# Patient Record
Sex: Female | Born: 1970 | Race: White | Hispanic: No | Marital: Single | Smoking: Current every day smoker
Health system: Southern US, Community
[De-identification: ages and names within clinical notes are randomized; demographics above are authoritative.]

## PROBLEM LIST (undated history)

## (undated) DIAGNOSIS — Z01812 Encounter for preprocedural laboratory examination: Secondary | ICD-10-CM

## (undated) DIAGNOSIS — G5602 Carpal tunnel syndrome, left upper limb: Secondary | ICD-10-CM

## (undated) DIAGNOSIS — K2289 Other specified disease of esophagus: Secondary | ICD-10-CM

## (undated) DIAGNOSIS — R42 Dizziness and giddiness: Secondary | ICD-10-CM

## (undated) DIAGNOSIS — E559 Vitamin D deficiency, unspecified: Secondary | ICD-10-CM

## (undated) DIAGNOSIS — M1990 Unspecified osteoarthritis, unspecified site: Secondary | ICD-10-CM

## (undated) DIAGNOSIS — G43909 Migraine, unspecified, not intractable, without status migrainosus: Secondary | ICD-10-CM

## (undated) HISTORY — PX: TUBAL LIGATION: SHX77

---

## 2017-01-15 ENCOUNTER — Emergency Department (HOSPITAL_COMMUNITY): Payer: Self-pay

## 2017-01-15 ENCOUNTER — Emergency Department (HOSPITAL_COMMUNITY)
Admission: EM | Admit: 2017-01-15 | Discharge: 2017-01-15 | Disposition: A | Discharge status: Home / Self Care | Payer: Self-pay | Attending: Emergency Medicine | Admitting: Emergency Medicine

## 2017-01-15 ENCOUNTER — Encounter (HOSPITAL_COMMUNITY): Payer: Self-pay

## 2017-01-15 DIAGNOSIS — Y92831 Amusement park as the place of occurrence of the external cause: Secondary | ICD-10-CM | POA: Insufficient documentation

## 2017-01-15 DIAGNOSIS — S060X0A Concussion without loss of consciousness, initial encounter: Secondary | ICD-10-CM | POA: Insufficient documentation

## 2017-01-15 DIAGNOSIS — F17200 Nicotine dependence, unspecified, uncomplicated: Secondary | ICD-10-CM | POA: Insufficient documentation

## 2017-01-15 DIAGNOSIS — W19XXXA Unspecified fall, initial encounter: Secondary | ICD-10-CM

## 2017-01-15 DIAGNOSIS — Y9389 Activity, other specified: Secondary | ICD-10-CM | POA: Insufficient documentation

## 2017-01-15 DIAGNOSIS — W01198A Fall on same level from slipping, tripping and stumbling with subsequent striking against other object, initial encounter: Secondary | ICD-10-CM | POA: Insufficient documentation

## 2017-01-15 DIAGNOSIS — R42 Dizziness and giddiness: Secondary | ICD-10-CM | POA: Insufficient documentation

## 2017-01-15 DIAGNOSIS — Y998 Other external cause status: Secondary | ICD-10-CM | POA: Insufficient documentation

## 2017-01-15 HISTORY — DX: Dizziness and giddiness: R42

## 2017-01-15 HISTORY — DX: Vitamin D deficiency, unspecified: E55.9

## 2017-01-15 HISTORY — DX: Unspecified osteoarthritis, unspecified site: M19.90

## 2017-01-15 HISTORY — DX: Migraine, unspecified, not intractable, without status migrainosus: G43.909

## 2017-01-15 MED ORDER — MECLIZINE HCL 25 MG PO TABS: 25.0000 mg | ORAL_TABLET | Freq: Three times a day (TID) | ORAL | 0 refills | Status: AC | PRN

## 2017-01-15 MED ORDER — MECLIZINE HCL 25 MG PO TABS
25.0000 mg | ORAL_TABLET | Freq: Once | ORAL | Status: AC
  Administered 2017-01-15: 25 mg via ORAL
  Filled 2017-01-15: qty 1

## 2017-01-15 MED ORDER — ONDANSETRON HCL 4 MG PO TABS: 4.0000 mg | ORAL_TABLET | Freq: Three times a day (TID) | ORAL | 0 refills | Status: AC | PRN

## 2017-01-15 MED ORDER — ONDANSETRON 4 MG PO TBDP
4.0000 mg | ORAL_TABLET | Freq: Once | ORAL | Status: AC
  Administered 2017-01-15: 4 mg via ORAL
  Filled 2017-01-15: qty 1

## 2017-01-15 MED ORDER — KETOROLAC TROMETHAMINE 60 MG/2ML IM SOLN: 60.0000 mg | Freq: Once | INTRAMUSCULAR | Status: DC

## 2017-01-15 MED ORDER — KETOROLAC TROMETHAMINE 30 MG/ML IJ SOLN
30.0000 mg | Freq: Once | INTRAMUSCULAR | Status: AC
  Administered 2017-01-15: 30 mg via INTRAVENOUS
  Filled 2017-01-15: qty 1

## 2017-01-15 MED ORDER — CYCLOBENZAPRINE HCL 10 MG PO TABS: 10.0000 mg | ORAL_TABLET | Freq: Three times a day (TID) | ORAL | 0 refills | Status: AC | PRN

## 2017-01-15 NOTE — ED Triage Notes (Signed)
Pt brought in by EMS due to having fall. Pt feel backwards and hit head. Pt denies LOC. Pt endorse, dizziness, HA, and nausea. Pt a&ox4.

## 2017-01-15 NOTE — ED Provider Notes (Signed)
[facility]-EMERGENCY DEPT Provider Note   CSN: [id number] Arrival date & time: 01/15/17  1652     History   Chief Complaint Chief Complaint  Patient presents with  . Fall  . Dizziness    HPI Monica Rowe is a 46 y.o. female.  HPI   Patient with hx vertigo, migraine brought in by EMS after mechanical fall at [employer].  States she was in the locker room and turned around, slipped on a wet spot on the floor, and fell backwards striking the back of her head on a locker.  Denies LOC.  States she was "stunned" and "saw stars," and has been "out of it" and "dizzy."  She describes the sensation as "swimmy headed" and "slow motion" like she's intoxicated.  She is sensitive to lights, nauseated.  The pain is in the back of her head and radiates into her face. She also has neck pain and right shoulder pain.The fall occurred at 2:30pm.  Pt denies hx concussion.  Denies any other pain or injury.  Denies weakness or numbness of the extremities, visual changes, vomiting.    Past Medical History:  Diagnosis Date  . Arthritis   . Migraine   . Vertigo   . Vitamin D deficiency     There are no active problems to display for this patient.   Past Surgical History:  Procedure Laterality Date  . CESAREAN SECTION    . TUBAL LIGATION      OB History    No data available       Home Medications    Prior to Admission medications   Medication Sig Start Date End Date Taking? Authorizing Provider  cyclobenzaprine (FLEXERIL) 10 MG tablet Take 1 tablet (10 mg total) by mouth 3 (three) times daily as needed for muscle spasms (or pain). 01/15/17   Trixie DredgeWest, Lathyn Griggs, PA-C  meclizine (ANTIVERT) 25 MG tablet Take 1 tablet (25 mg total) by mouth 3 (three) times daily as needed for dizziness. 01/15/17   Trixie DredgeWest, Haddy Mullinax, PA-C  ondansetron (ZOFRAN) 4 MG tablet Take 1 tablet (4 mg total) by mouth every 8 (eight) hours as needed for nausea or vomiting. 01/15/17   Trixie DredgeWest, Arius Harnois, PA-C    Family History History reviewed. No  pertinent family history.  Social History Social History  Substance Use Topics  . Smoking status: Current Every Day Smoker  . Smokeless tobacco: Never Used  . Alcohol use No     Allergies   Aspirin   Review of Systems Review of Systems  All other systems reviewed and are negative.    Physical Exam Updated Vital Signs BP 140/62   Pulse 69   Temp 98.6 F (37 C) (Oral)   Resp 12   Ht 5\' 1"  (1.549 m)   Wt 83.5 kg (184 lb)   SpO2 97%   BMI 34.77 kg/m   Physical Exam  Constitutional: She is oriented to person, place, and time. She appears well-developed and well-nourished. No distress.  HENT:  Head: Normocephalic and atraumatic.  Neck:  Towel around neck  Cardiovascular: Normal rate and regular rhythm.   Pulmonary/Chest: Effort normal and breath sounds normal. No respiratory distress. She has no wheezes. She has no rales.  Abdominal: Soft. She exhibits no distension. There is no tenderness. There is no rebound and no guarding.  Neurological: She is alert and oriented to person, place, and time. She has normal strength. No cranial nerve deficit or sensory deficit. She exhibits normal muscle tone. GCS eye subscore is  4. GCS verbal subscore is 5. GCS motor subscore is 6.  Skin: She is not diaphoretic.  Nursing note and vitals reviewed.    ED Treatments / Results  Labs (all labs ordered are listed, but only abnormal results are displayed) Labs Reviewed - No data to display  EKG  EKG Interpretation  Date/Time:  Tuesday January 15 2017 17:14:15 EDT Ventricular Rate:  69 PR Interval:    QRS Duration: 73 QT Interval:  419 QTC Calculation: 449 R Axis:   -3 Text Interpretation:  Sinus rhythm Low voltage, precordial leads No previous ECGs available Confirmed by Richardean Canal [phone]) on 01/15/2017 5:23:19 PM       Radiology Dg Shoulder Right  Result Date: 01/15/2017 CLINICAL DATA:  Slip and fall with right shoulder pain, initial encounter EXAM: RIGHT SHOULDER - 2+  VIEW COMPARISON:  None. FINDINGS: Mild degenerative changes of the acromioclavicular joint are noted. No acute fracture or dislocation is seen. The underlying bony thorax is within normal limits. IMPRESSION: Mild degenerative change without acute abnormality. Electronically Signed   By: Alcide Clever M.D.   On: 01/15/2017 18:18   Ct Head Wo Contrast  Result Date: 01/15/2017 CLINICAL DATA:  Mechanical fall, struck head, concussion symptoms, headache, smoker EXAM: CT HEAD WITHOUT CONTRAST CT CERVICAL SPINE WITHOUT CONTRAST TECHNIQUE: Multidetector CT imaging of the head and cervical spine was performed following the standard protocol without intravenous contrast. Multiplanar CT image reconstructions of the cervical spine were also generated. COMPARISON:  None FINDINGS: CT HEAD FINDINGS Brain: Normal ventricular morphology. No midline shift or mass effect. Normal appearance of brain parenchyma. No intracranial hemorrhage, mass lesion, or evidence acute infarction. No extra-axial fluid collections. Vascular: Unremarkable Skull: Intact Sinuses/Orbits: Opacified LEFT maxillary sinus with air-fluid level. No visualized maxillary sinus fracture. Remaining paranasal sinuses clear. Other: N/A CT CERVICAL SPINE FINDINGS Alignment: Normal Skull base and vertebrae: Skullbase intact. Vertebral body heights maintained. No fracture or bone destruction. Soft tissues and spinal canal: Prevertebral soft tissues normal thickness. Visualize cervical cyst soft tissues otherwise unremarkable. Disc levels:  Unremarkable Upper chest: Lung apices clear Other: N/A IMPRESSION: No acute intracranial abnormalities. LEFT maxillary sinus disease as above. No acute cervical spine abnormalities. Electronically Signed   By: Ulyses Southward M.D.   On: 01/15/2017 18:09   Ct Cervical Spine Wo Contrast  Result Date: 01/15/2017 CLINICAL DATA:  Mechanical fall, struck head, concussion symptoms, headache, smoker EXAM: CT HEAD WITHOUT CONTRAST CT CERVICAL  SPINE WITHOUT CONTRAST TECHNIQUE: Multidetector CT imaging of the head and cervical spine was performed following the standard protocol without intravenous contrast. Multiplanar CT image reconstructions of the cervical spine were also generated. COMPARISON:  None FINDINGS: CT HEAD FINDINGS Brain: Normal ventricular morphology. No midline shift or mass effect. Normal appearance of brain parenchyma. No intracranial hemorrhage, mass lesion, or evidence acute infarction. No extra-axial fluid collections. Vascular: Unremarkable Skull: Intact Sinuses/Orbits: Opacified LEFT maxillary sinus with air-fluid level. No visualized maxillary sinus fracture. Remaining paranasal sinuses clear. Other: N/A CT CERVICAL SPINE FINDINGS Alignment: Normal Skull base and vertebrae: Skullbase intact. Vertebral body heights maintained. No fracture or bone destruction. Soft tissues and spinal canal: Prevertebral soft tissues normal thickness. Visualize cervical cyst soft tissues otherwise unremarkable. Disc levels:  Unremarkable Upper chest: Lung apices clear Other: N/A IMPRESSION: No acute intracranial abnormalities. LEFT maxillary sinus disease as above. No acute cervical spine abnormalities. Electronically Signed   By: Ulyses Southward M.D.   On: 01/15/2017 18:09    Procedures Procedures (including critical care time)  Medications Ordered in ED Medications  meclizine (ANTIVERT) tablet 25 mg (25 mg Oral Given 01/15/17 1901)  ondansetron (ZOFRAN-ODT) disintegrating tablet 4 mg (4 mg Oral Given 01/15/17 1901)  ketorolac (TORADOL) 30 MG/ML injection 30 mg (30 mg Intravenous Given 01/15/17 1901)     Initial Impression / Assessment and Plan / ED Course  I have reviewed the triage vital signs and the nursing notes.  Pertinent labs & imaging results that were available during my care of the patient were reviewed by me and considered in my medical decision making (see chart for details).     Afebrile, nontoxic patient with mechanical  fall, hitting head on locker, with NO LOC but with description concerning for concussion.  CT head, c-spine, right shoulder xray negative for acute pathology.  Symptoms treatment.   D/C home with symptomatic medications, concussion clinic and PCP follow up.  Discussed result, findings, treatment, and follow up  with patient.  Pt given return precautions.  Pt verbalizes understanding and agrees with plan.       Final Clinical Impressions(s) / ED Diagnoses   Final diagnoses:  Fall, initial encounter  Concussion without loss of consciousness, initial encounter    New Prescriptions New Prescriptions   CYCLOBENZAPRINE (FLEXERIL) 10 MG TABLET    Take 1 tablet (10 mg total) by mouth 3 (three) times daily as needed for muscle spasms (or pain).   MECLIZINE (ANTIVERT) 25 MG TABLET    Take 1 tablet (25 mg total) by mouth 3 (three) times daily as needed for dizziness.   ONDANSETRON (ZOFRAN) 4 MG TABLET    Take 1 tablet (4 mg total) by mouth every 8 (eight) hours as needed for nausea or vomiting.     Trixie Dredge, Cordelia Poche 01/15/17 1915    Charlynne Pander, MD 01/17/17 Barry Brunner

## 2017-01-15 NOTE — Discharge Instructions (Signed)
Read the information below.  Use the prescribed medication as directed.  Please discuss all new medications with your pharmacist.  You may return to the Emergency Department at any time for worsening condition or any new symptoms that concern you.     Please call Dr Michaelle CopasSmith's office to follow up at the concussion clinic.    Take medications as needed for symptom management.  You have had a head injury which does not appear to require admission at this time. A concussion is a state of changed mental ability from trauma. SEEK IMMEDIATE MEDICAL ATTENTION IF: There is confusion or drowsiness (although children frequently become drowsy after injury).  You cannot awaken the injured person.  There is nausea (feeling sick to your stomach) or continued, forceful vomiting.  You notice dizziness or unsteadiness which is getting worse, or inability to walk.  You have convulsions or unconsciousness.  You experience severe, persistent headaches not relieved by Tylenol?. (Do not take aspirin as this impairs clotting abilities). Take other pain medications only as directed.  You cannot use arms or legs normally.  There are changes in pupil sizes. (This is the black center in the colored part of the eye)  There is clear or bloody discharge from the nose or ears.  Change in speech, vision, swallowing, or understanding.  Localized weakness, numbness, tingling, or change in bowel or bladder control.

## 2017-01-24 ENCOUNTER — Telehealth: Payer: Self-pay

## 2017-01-24 NOTE — Telephone Encounter (Signed)
Patient called concussion hotline after a referral from the ED. She slipped and feel on 7/17 at [employer] hitting her head on the posterior side 2 times: once on a locker and then on the floor. She did not lose consciousness but saw "stars", developed a headache and nausea. The nausea has persisted and she feels like she is in slow motion. She does not have a history of concussion but does have a history of migraines and vertigo. 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 She notes that working on the computer does seem to make her worse. Recommended to patient to refrain from physical activity except walking on flat surfaces 10 minutes a day and to take frequent breaks from work. Also told patient to go back into ED if she has sudden onset of symptoms that are too extreme to manage. Patient voices understanding.

## 2017-01-28 NOTE — Progress Notes (Deleted)
Subjective:   @VITALSMCOMMENTS @  Chief Complaint: Monica Rowe, DOB: April 18, 1971, is a 46 y.o. female who presents for No chief complaint on file.   Injury date : *** Visit #: ***  History of Present Illness:   Patient's goals/priorities: ***  CLASS CURRENT GRADE COMMENTS                               Concussion Self-Reported Symptom Score Symptoms rated on a scale 1-6, in last 24 hours  Headache: ***    Nausea: ***  Vomiting: ***  Balance Difficulty: ***   Dizziness: ***  Fatigue: ***  Trouble Falling Asleep: ***   Sleep More Than Usual: ***  Sleep Less Than Usual: ***  Daytime Drowsiness: ***  Photophobia: ***  Phonophobia: ***  Feeling anxious: ***  Confused: ***  Irritability: ***  Sadness: ***  Nervousness: ***  Feeling More Emotional: ***  Numbness or Tingling: ***  Feeling Slowed Down: ***  Feeling Mentally Foggy: ***  Difficulty Concentrating: ***  Difficulty Remembering: ***  Visual Problems: ***  Neck Pain: ***  Tinnitus: ***   Total Symptom Score: *** Previous Symptom Score: ***  Review of Systems: Pertinent items are noted in HPI.  Review of History: Past Medical History: @PMHP @  Past Surgical History:  has a past surgical history that includes Tubal ligation and Cesarean section. Family History: family history is not on file. Social History:  reports that she has been smoking.  She has never used smokeless tobacco. She reports that she does not drink alcohol or use drugs. Current Medications: has a current medication list which includes the following prescription(s): cyclobenzaprine, meclizine, and ondansetron. Allergies: is allergic to aspirin.  Objective:    Physical Examination There were no vitals filed for this visit. General appearance: alert, appears stated age and cooperative Head: Normocephalic, without obvious abnormality, atraumatic Eyes: conjunctivae/corneas clear. PERRL, EOM's intact. Fundi benign. Sclera  anicteric. Lungs: clear to auscultation bilaterally and percussion Heart: regular rate and rhythm, S1, S2 normal, no murmur, click, rub or gallop Neurologic: CN 2-12 normal.  Sensation to pain, touch, and proprioception normal.  DTRs  normal in upper and lower extremities. No pathologic reflexes. Neg rhomberg, modified rhomberg, pronator drift, tandem gait, finger-to-nose; see post-concussion vestibular and oculomotor testing in chart Psychiatric: Oriented X3, intact recent and remote memory, judgement and insight, normal mood and affect  Concussion testing performed today:  Neurocognitive testing (ImPACT):  Baseline:*** Post #1: ***   Verbal Memory Composite *** (***%) *** (***%)   Visual Memory Composite *** (***%) *** (***%)   Visual Motor Speed Composite *** (***%) *** (***%)   Reaction Time Composite *** (***%) *** (***%)   Cognitive Efficiency Index *** ***     Vestibular Screening:   Pre VOMS  HA Score: *** Pre VOMS  Dizziness Score: ***   Headache  Dizziness  Smooth Pursuits *** ***  H. Saccades *** ***  V. Saccades *** ***  H. VOR *** ***  V. VOR *** ***  Visual Motor Sensitivity *** ***  Accommodation Right: *** cm Left: *** cm *** ***  Convergence: *** cm Divergence: *** cm *** ***   Balance Screen: ***  Additional testing performed today: { :28529}   Assessment:    No diagnosis found.  Monica DageSheila Seipp presents with the following concussion subtypes. [] Cognitive [] Cervical [] Vestibular [] Ocular [] Migraine [] Anxiety/Mood   Plan:   Action/Discussion: Reviewed diagnosis, management options, expected outcomes, and the reasons for  scheduled and emergent follow-up. Questions were adequately answered. Patient expressed verbal understanding and agreement with the following plan.      Participation in school/work: Patient is cleared to return to work/school and activities of daily living without restrictions.  Patient is not cleared to return to work/school  until further notice.  Patient may return to work/school on ***, with the following restrictions/supports:  Length of Day:  Please allow patient to use *** class as study hall in a quiet area.  Shortened school/work day: Recommend *** until ***.  Recommend core classes only.  Extra Time:  Take mental rest breaks during the day as needed. Check for return of symptoms when participating in any activities that require a significant amount of attention or concentration.  Allow extra time to complete tasks.  Please allow *** weeks to make up missed assignments, test, quizzes.  Visual/Vestibular Accommodations in School:  Allow patient to eat lunch in quiet environment with 1-2 classmates.  Allow patient to leave class 5 minutes before end of period to avoid busy/noisy hallway.  Please provide any supplemental learning materials (power points, lecture notes, handouts, etc) in minimum size 18 font and allow/provide any auditory supplements to learning when possible (books on tape, audio tape lectures, etc) to limit visual stress in the classroom.  Patient is cleared for auditory participation only. Patient is not cleared for homework, quizzes, or tests at this time.   Testing:  May begin taking tests/quizzes on *** with no more than one test/quiz per day.   No significant classroom or standardized testing until ***.  Home/Extracurricular:  Lessen work/homework load to allow adequate cognitive rest. Work *** minutes with intervals of *** minute breaks (total *** hours).  Limit visual stimulants including: driving, watching television/movies, reading, using cell phone, etc. - to ensure relative visual cognitive rest. NOT cleared for video or phone games. May participate *** minutes with intervals of *** minute breaks (total *** hours).   Participation in physical activity: Patient is cleared to return to physical activity participation without restrictions.  Patient is not cleared  for formal physical activity (includes physical education class, sports practices, sports games, weight training, etc) at this time.   However, we recommend that patient has 20-30 minutes of light cardiovascular activity daily, with NO risk of head injury (example: walking), staying below level of symptoms.  Recommend gradual progression to *** vestibular exercise (30-45 min/day) with no risk of head trauma while staying below level of symptoms.  See your exercise treatment menu for details.   Begin your exercise prescription on ____________ (see separate exercise prescription form)  Cleared for physical activity that poses NO RISK of head trauma.   Gradual return to physical activity under the supervision of a physician and/or athletic trainer  Once asymptomatic for 24 hours, patient may start Stage *** of CFPSM's { :10024} Exercise Progression Protocol. This is to be monitored by patient's { :28383}    Patient may start Stage *** of CFPSM's { :10024} Exercise Progression Protocol. This is to be monitored by patient's { :28383}.   Patient is not cleared for full contact activities, activities with risk of head trauma or unsupervised physical activity while participating in CFPSM's Exercise Progression. Check for return of symptoms (using Concussion Education Form Symptom List) when participating in activity and 24 hours following. If symptoms return, patient to contact our office for further recommendations.    [facility] Medical Recommendations form has been given to patient allowing *** to progress patient back into full  participation.  Active Treatment Strategies:  Fueling your brain is important for recovery. It is essential to stay well hydrated, aiming for half of your body weight in fluid ounces per day (100 lbs = 50 oz). We also recommend eating breakfast to start your day and focus on a well-balanced diet containing lean protein, 'good' fats, and complex carbohydrates. See your  nutrition / hydration handout for more details.   Quality sleep is vital in your concussion recovery. We encourage lots of sleep for the first 24-72 hours after injury but following this period it is important to regulate your sleep cycle. We encourage *** hours of quality sleep per night. See your sleep handout for more details and strategies to quality sleep.  IF NOT USING THE OPTIONS BELOW DELETE THEM  Treating your vestibular and visual dysfunction will decrease your recovery time and improve your symptoms. Begin your home vestibular exercise program as directed on your AVS.    Begin taking Amantadine medicine as directed.   Begin taking DHA supplement as directed.    Begin home exercise program for neck as directed.   Follow-up information:  [facility] in *** .   Call [facility] at [phone] at least 24 hours after completion of Stage 4 with status update.  Patient needs to arrive 30 minutes prior to appointment to complete the following tests: { :28378}.    Patient Education:  Reviewed with patient the risks (i.e, a repeat concussion, post-concussion syndrome, second-impact syndrome) of returning to play prior to complete resolution, and thoroughly reviewed the signs and symptoms of concussion.Reviewed need for complete resolution of all symptoms, with rest AND exertion, prior to return to play.  Reviewed red flags for urgent medical evaluation: worsening symptoms, nausea/vomiting, intractable headache, musculoskeletal changes, focal neurological deficits.  [facility]'s Concussion Care Plan, which clearly outlines the plans stated above, was given to patient.  I was personally involved with the physical evaluation of and am in agreement with the assessment and treatment plan for this patient.  Greater than 50% of this encounter was spent in direct consultation with the patient in evaluation, counseling, and  coordination of care. Duration of encounter: { :28531} minutes.  After Visit Summary printed out and provided to patient as appropriate.  This note is written by Judi SaaZachary M Smith, in the presence of and acting as the scribe of Judi SaaZachary M Smith, DO.

## 2017-01-30 ENCOUNTER — Ambulatory Visit: Payer: Self-pay | Admitting: Family Medicine

## 2017-02-02 NOTE — Progress Notes (Deleted)
Subjective:   @VITALSMCOMMENTS @  Chief Complaint: Monica Rowe, DOB: 01-12-1971, is a 46 y.o. female who presents for No chief complaint on file.   Injury date : *** Visit #: ***  History of Present Illness:   Patient's goals/priorities: ***  CLASS CURRENT GRADE COMMENTS                               Concussion Self-Reported Symptom Score Symptoms rated on a scale 1-6, in last 24 hours  Headache: ***    Nausea: ***  Vomiting: ***  Balance Difficulty: ***   Dizziness: ***  Fatigue: ***  Trouble Falling Asleep: ***   Sleep More Than Usual: ***  Sleep Less Than Usual: ***  Daytime Drowsiness: ***  Photophobia: ***  Phonophobia: ***  Feeling anxious: ***  Confused: ***  Irritability: ***  Sadness: ***  Nervousness: ***  Feeling More Emotional: ***  Numbness or Tingling: ***  Feeling Slowed Down: ***  Feeling Mentally Foggy: ***  Difficulty Concentrating: ***  Difficulty Remembering: ***  Visual Problems: ***  Neck Pain: ***  Tinnitus: ***   Total Symptom Score: *** Previous Symptom Score: ***  Review of Systems: Pertinent items are noted in HPI.  Review of History: Past Medical History: @PMHP @  Past Surgical History:  has a past surgical history that includes Tubal ligation and Cesarean section. Family History: family history is not on file. Social History:  reports that she has been smoking.  She has never used smokeless tobacco. She reports that she does not drink alcohol or use drugs. Current Medications: has a current medication list which includes the following prescription(s): cyclobenzaprine, meclizine, and ondansetron. Allergies: is allergic to aspirin.  Objective:    Physical Examination There were no vitals filed for this visit. General appearance: alert, appears stated age and cooperative Head: Normocephalic, without obvious abnormality, atraumatic Eyes: conjunctivae/corneas clear. PERRL, EOM's intact. Fundi benign. Sclera  anicteric. Lungs: clear to auscultation bilaterally and percussion Heart: regular rate and rhythm, S1, S2 normal, no murmur, click, rub or gallop Neurologic: CN 2-12 normal.  Sensation to pain, touch, and proprioception normal.  DTRs  normal in upper and lower extremities. No pathologic reflexes. Neg rhomberg, modified rhomberg, pronator drift, tandem gait, finger-to-nose; see post-concussion vestibular and oculomotor testing in chart Psychiatric: Oriented X3, intact recent and remote memory, judgement and insight, normal mood and affect  Concussion testing performed today:  Neurocognitive testing (ImPACT):  Baseline:*** Post #1: ***   Verbal Memory Composite *** (***%) *** (***%)   Visual Memory Composite *** (***%) *** (***%)   Visual Motor Speed Composite *** (***%) *** (***%)   Reaction Time Composite *** (***%) *** (***%)   Cognitive Efficiency Index *** ***     Vestibular Screening:   Pre VOMS  HA Score: *** Pre VOMS  Dizziness Score: ***   Headache  Dizziness  Smooth Pursuits *** ***  H. Saccades *** ***  V. Saccades *** ***  H. VOR *** ***  V. VOR *** ***  Visual Motor Sensitivity *** ***  Accommodation Right: *** cm Left: *** cm *** ***  Convergence: *** cm Divergence: *** cm *** ***   Balance Screen: ***  Additional testing performed today: { :28529}   Assessment:    No diagnosis found.  Monica Rowe presents with the following concussion subtypes. [] Cognitive [] Cervical [] Vestibular [] Ocular [] Migraine [] Anxiety/Mood   Plan:   Action/Discussion: Reviewed diagnosis, management options, expected outcomes, and the reasons for  scheduled and emergent follow-up. Questions were adequately answered. Patient expressed verbal understanding and agreement with the following plan.      Participation in school/work: Patient is cleared to return to work/school and activities of daily living without restrictions.  Patient is not cleared to return to work/school  until further notice.  Patient may return to work/school on ***, with the following restrictions/supports:  Length of Day:  Please allow patient to use *** class as study hall in a quiet area.  Shortened school/work day: Recommend *** until ***.  Recommend core classes only.  Extra Time:  Take mental rest breaks during the day as needed. Check for return of symptoms when participating in any activities that require a significant amount of attention or concentration.  Allow extra time to complete tasks.  Please allow *** weeks to make up missed assignments, test, quizzes.  Visual/Vestibular Accommodations in School:  Allow patient to eat lunch in quiet environment with 1-2 classmates.  Allow patient to leave class 5 minutes before end of period to avoid busy/noisy hallway.  Please provide any supplemental learning materials (power points, lecture notes, handouts, etc) in minimum size 18 font and allow/provide any auditory supplements to learning when possible (books on tape, audio tape lectures, etc) to limit visual stress in the classroom.  Patient is cleared for auditory participation only. Patient is not cleared for homework, quizzes, or tests at this time.   Testing:  May begin taking tests/quizzes on *** with no more than one test/quiz per day.   No significant classroom or standardized testing until ***.  Home/Extracurricular:  Lessen work/homework load to allow adequate cognitive rest. Work *** minutes with intervals of *** minute breaks (total *** hours).  Limit visual stimulants including: driving, watching television/movies, reading, using cell phone, etc. - to ensure relative visual cognitive rest. NOT cleared for video or phone games. May participate *** minutes with intervals of *** minute breaks (total *** hours).   Participation in physical activity: Patient is cleared to return to physical activity participation without restrictions.  Patient is not cleared  for formal physical activity (includes physical education class, sports practices, sports games, weight training, etc) at this time.   However, we recommend that patient has 20-30 minutes of light cardiovascular activity daily, with NO risk of head injury (example: walking), staying below level of symptoms.  Recommend gradual progression to *** vestibular exercise (30-45 min/day) with no risk of head trauma while staying below level of symptoms.  See your exercise treatment menu for details.   Begin your exercise prescription on ____________ (see separate exercise prescription form)  Cleared for physical activity that poses NO RISK of head trauma.   Gradual return to physical activity under the supervision of a physician and/or athletic trainer  Once asymptomatic for 24 hours, patient may start Stage *** of CFPSM's { :10024} Exercise Progression Protocol. This is to be monitored by patient's { :28383}    Patient may start Stage *** of CFPSM's { :10024} Exercise Progression Protocol. This is to be monitored by patient's { :28383}.   Patient is not cleared for full contact activities, activities with risk of head trauma or unsupervised physical activity while participating in CFPSM's Exercise Progression. Check for return of symptoms (using Concussion Education Form Symptom List) when participating in activity and 24 hours following. If symptoms return, patient to contact our office for further recommendations.    [facility] Medical Recommendations form has been given to patient allowing *** to progress patient back into full  participation.  Active Treatment Strategies:  Fueling your brain is important for recovery. It is essential to stay well hydrated, aiming for half of your body weight in fluid ounces per day (100 lbs = 50 oz). We also recommend eating breakfast to start your day and focus on a well-balanced diet containing lean protein, 'good' fats, and complex carbohydrates. See your  nutrition / hydration handout for more details.   Quality sleep is vital in your concussion recovery. We encourage lots of sleep for the first 24-72 hours after injury but following this period it is important to regulate your sleep cycle. We encourage *** hours of quality sleep per night. See your sleep handout for more details and strategies to quality sleep.  IF NOT USING THE OPTIONS BELOW DELETE THEM  Treating your vestibular and visual dysfunction will decrease your recovery time and improve your symptoms. Begin your home vestibular exercise program as directed on your AVS.    Begin taking Amantadine medicine as directed.   Begin taking DHA supplement as directed.    Begin home exercise program for neck as directed.   Follow-up information:  [facility] at [facility] in *** .   Call [facility] at [phone] at least 24 hours after completion of Stage 4 with status update.  Patient needs to arrive 30 minutes prior to appointment to complete the following tests: { :28378}.    Patient Education:  Reviewed with patient the risks (i.e, a repeat concussion, post-concussion syndrome, second-impact syndrome) of returning to play prior to complete resolution, and thoroughly reviewed the signs and symptoms of concussion.Reviewed need for complete resolution of all symptoms, with rest AND exertion, prior to return to play.  Reviewed red flags for urgent medical evaluation: worsening symptoms, nausea/vomiting, intractable headache, musculoskeletal changes, focal neurological deficits.  [facility] Concussion Care Plan, which clearly outlines the plans stated above, was given to patient.  I was personally involved with the physical evaluation of and am in agreement with the assessment and treatment plan for this patient.  Greater than 50% of this encounter was spent in direct consultation with the patient in evaluation, counseling, and  coordination of care. Duration of encounter: { :28531} minutes.  After Visit Summary printed out and provided to patient as appropriate.  This note is written by Judi SaaZachary M Suhayb Anzalone, in the presence of and acting as the scribe of Judi SaaZachary M Paulita Licklider, DO.

## 2017-02-04 ENCOUNTER — Ambulatory Visit: Payer: Self-pay | Admitting: Family Medicine

## 2017-02-04 ENCOUNTER — Telehealth: Payer: Self-pay | Admitting: Family Medicine

## 2017-02-04 NOTE — Telephone Encounter (Signed)
Left message for patient to call back to reschedule her concussion appointment.

## 2017-02-04 NOTE — Telephone Encounter (Signed)
Pt called [facility] and needs to resch her appt for today due to power ottage, she was told she may get a call tomorrow from you and she is fine with it, I didnt know where to sch her at. Thanks

## 2017-02-06 NOTE — Telephone Encounter (Signed)
Left patient second message to see if she would like to reschedule her appointment from earlier this week.

## 2018-11-13 IMAGING — DX DG SHOULDER 2+V*R*
2 series · 2 of 2 positions shown · non-contrast
Comparison: None.

CLINICAL DATA: Slip and fall with right shoulder pain, initial
encounter

EXAM:
RIGHT SHOULDER - 2+ VIEW

[w shoulder external right]
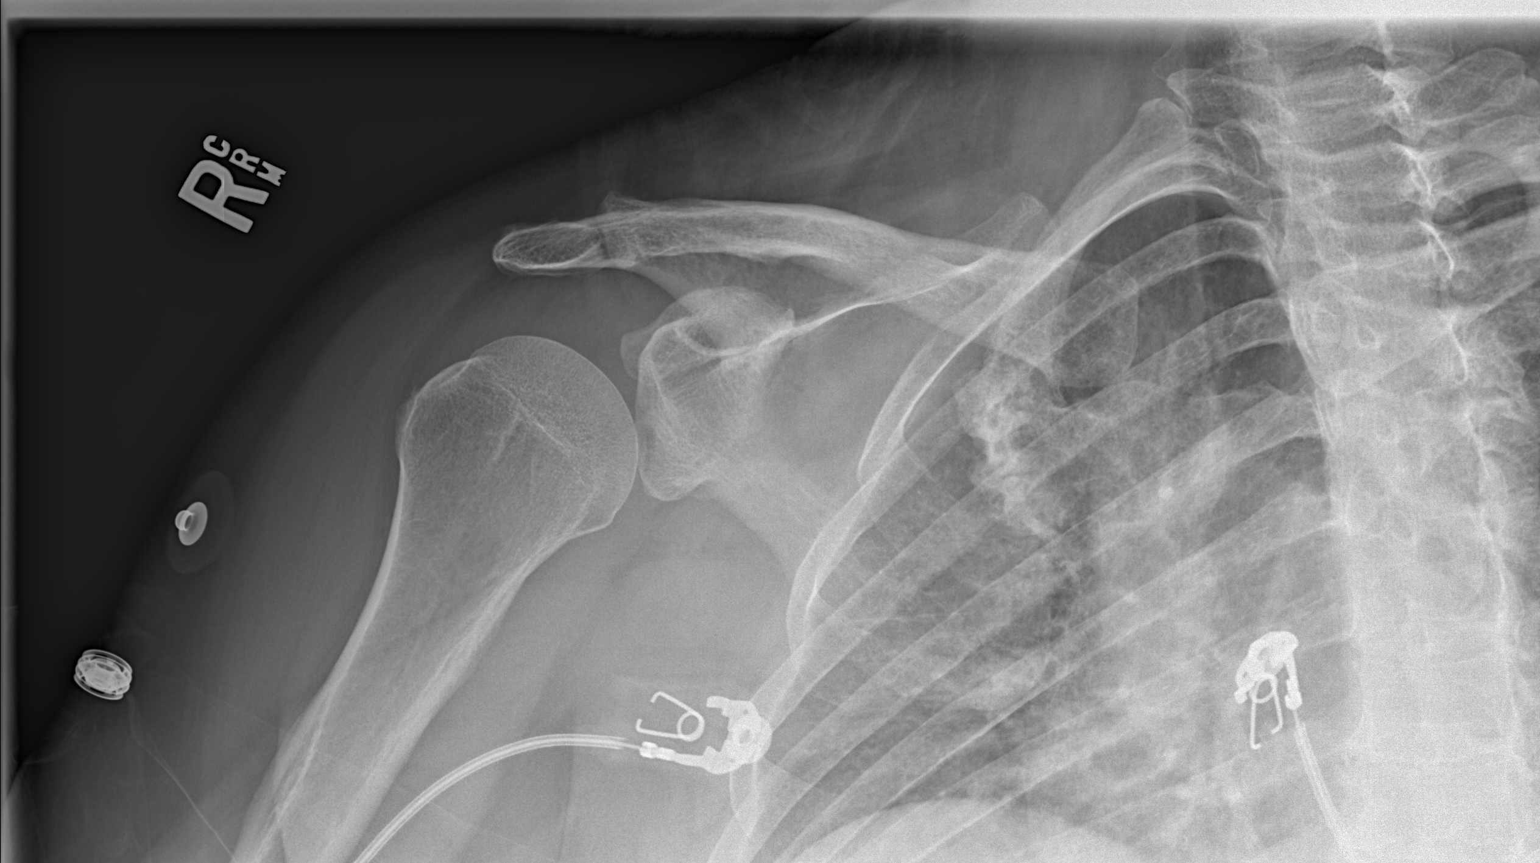

[w shoulder y-view right]
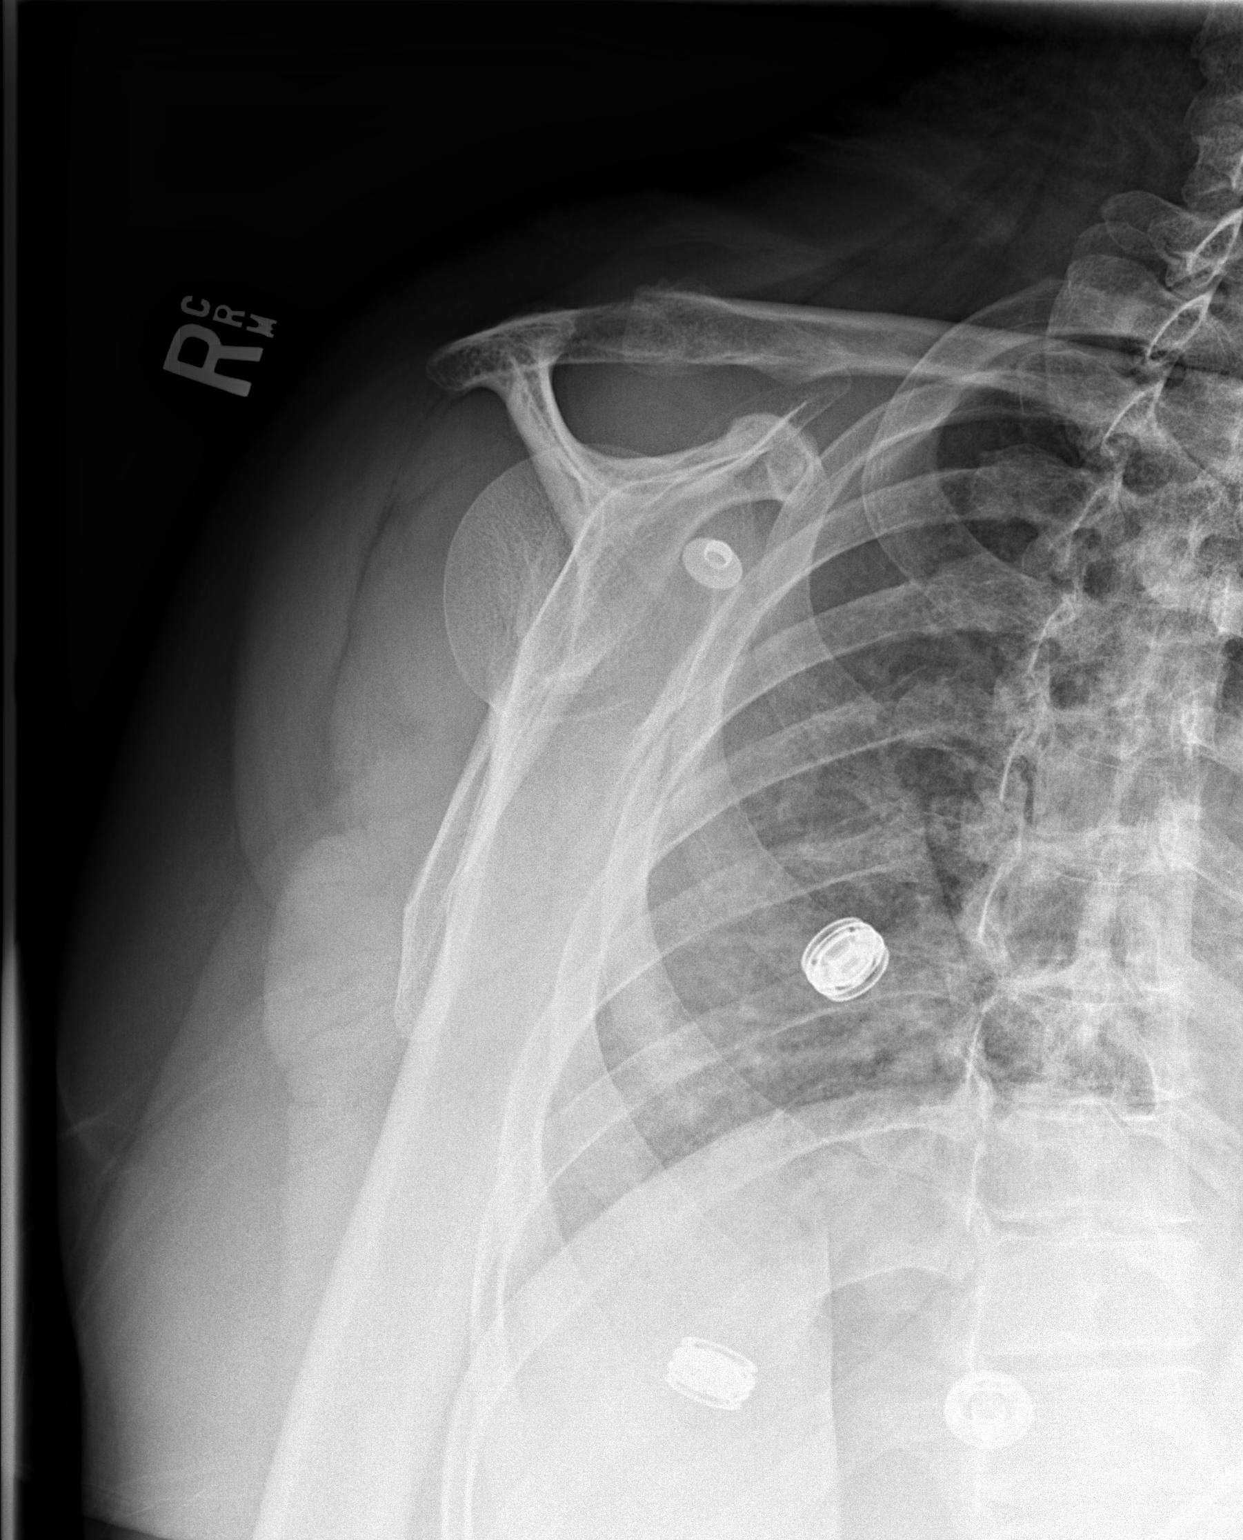

[2 of 2 positions shown; findings below may reference images not displayed]

FINDINGS: Mild degenerative changes of the acromioclavicular joint are noted.
No acute fracture or dislocation is seen. The underlying bony thorax
is within normal limits.
IMPRESSION: Mild degenerative change without acute abnormality.

## 2018-11-30 DIAGNOSIS — N3000 Acute cystitis without hematuria: Secondary | ICD-10-CM

## 2018-11-30 NOTE — ED Notes (Signed)
Patient received and voiced understanding of discharge instructions, patient ambulate out w/o complaint, no distress noted.

## 2018-11-30 NOTE — ED Provider Notes (Signed)
Sherri Gardner is a 48 y.o. female with past medical history notable for recent diagnosis of left obstructing ureteral stone, 5 mm, diagnosed 11/25/2018, onset of symptoms same day, seen by urology 11/27/2018, the stone was found to be in the bladder at that time.  She is now presenting with suprapubic pressure, burning with urination, frequency.  No further hematuria.  No difficulty voiding.  States that left flank pain is significantly improved.  No fevers, chills, vomiting.  + nausea.           No past medical history on file.    No past surgical history on file.      No family history on file.    Social History     Socioeconomic History   ??? Marital status: SINGLE     Spouse name: Not on file   ??? Number of children: Not on file   ??? Years of education: Not on file   ??? Highest education level: Not on file   Occupational History   ??? Not on file   Social Needs   ??? Financial resource strain: Not on file   ??? Food insecurity     Worry: Not on file     Inability: Not on file   ??? Transportation needs     Medical: Not on file     Non-medical: Not on file   Tobacco Use   ??? Smoking status: Not on file   Substance and Sexual Activity   ??? Alcohol use: Not on file   ??? Drug use: Not on file   ??? Sexual activity: Not on file   Lifestyle   ??? Physical activity     Days per week: Not on file     Minutes per session: Not on file   ??? Stress: Not on file   Relationships   ??? Social Wellsite geologist on phone: Not on file     Gets together: Not on file     Attends religious service: Not on file     Active member of club or organization: Not on file     Attends meetings of clubs or organizations: Not on file     Relationship status: Not on file   ??? Intimate partner violence     Fear of current or ex partner: Not on file     Emotionally abused: Not on file     Physically abused: Not on file     Forced sexual activity: Not on file   Other Topics Concern   ??? Not on file   Social History Narrative   ??? Not on file         ALLERGIES: Patient has  no known allergies.    Review of Systems   Constitutional: Negative for chills and fever.   Eyes: Negative for photophobia.   Respiratory: Negative for shortness of breath.    Cardiovascular: Negative for chest pain.   Gastrointestinal: Negative for abdominal pain.   Genitourinary: Negative for dysuria.   Musculoskeletal: Negative for back pain.   Neurological: Negative for headaches.   Psychiatric/Behavioral: Negative for confusion.   All other systems reviewed and are negative.      Vitals:    11/30/18 2025   BP: (!) 142/99   Pulse: (!) 104   Resp: 16   Temp: 98.8 ??F (37.1 ??C)   SpO2: 96%            Physical Exam  Vitals signs reviewed.  Constitutional:       General: She is not in acute distress.  HENT:      Head: Normocephalic and atraumatic.      Mouth/Throat:      Mouth: Mucous membranes are moist.      Pharynx: Oropharynx is clear.   Cardiovascular:      Rate and Rhythm: Normal rate and regular rhythm.      Heart sounds: Normal heart sounds.   Pulmonary:      Effort: Pulmonary effort is normal.      Breath sounds: Normal breath sounds.   Abdominal:      Tenderness: There is abdominal tenderness ( Mild suprapubic tenderness, no CVA tenderness). There is no right CVA tenderness, left CVA tenderness, guarding or rebound.   Musculoskeletal: Normal range of motion.      Right lower leg: No edema.      Left lower leg: No edema.   Skin:     General: Skin is warm and dry.      Capillary Refill: Capillary refill takes less than 2 seconds.   Neurological:      General: No focal deficit present.      Mental Status: She is alert and oriented to person, place, and time.   Psychiatric:         Mood and Affect: Mood normal.          MDM  Number of Diagnoses or Management Options  Acute cystitis without hematuria:   Diagnosis management comments: Symptoms and urinalysis consistent with UTI, no symptoms of renal colic at this point, urine sent for culture, has follow-up with urology next Thursday.  Advised her if she has  signs or symptoms of sepsis.  Vital signs reviewed.         Procedures    Chief Complaint   Patient presents with   ??? Pelvic Pain     MEDICATIONS GIVEN:  Medications   phenazopyridine (PYRIDIUM) tablet 200 mg (200 mg Oral Given 11/30/18 2151)   ketorolac (TORADOL) injection 15 mg (15 mg IntraVENous Given 11/30/18 2151)       LABS REVIEWED:  Recent Results (from the past 12 hour(s))   URINALYSIS W/MICROSCOPIC    Collection Time: 11/30/18  8:37 PM   Result Value Ref Range    Color YELLOW/STRAW      Appearance CLOUDY (A) CLEAR      Specific gravity 1.013 1.003 - 1.030      pH (UA) 7.0 5.0 - 8.0      Protein 30 (A) NEG mg/dL    Glucose Negative NEG mg/dL    Ketone Negative NEG mg/dL    Bilirubin Negative NEG      Blood SMALL (A) NEG      Urobilinogen 1.0 0.2 - 1.0 EU/dL    Nitrites Negative NEG      Leukocyte Esterase LARGE (A) NEG      WBC 50-100 0 - 4 /hpf    RBC 0-5 0 - 5 /hpf    Epithelial cells FEW FEW /lpf    Bacteria 2+ (A) NEG /hpf   URINE CULTURE HOLD SAMPLE    Collection Time: 11/30/18  8:37 PM   Result Value Ref Range    Urine culture hold        Urine on hold in Microbiology dept for 2 days.  If unpreserved urine is submitted, it cannot be used for addtional testing after 24 hours, recollection will be required.   HCG URINE, QL. - POC  Collection Time: 11/30/18  8:42 PM   Result Value Ref Range    Pregnancy test,urine (POC) Negative NEG     CBC WITH AUTOMATED DIFF    Collection Time: 11/30/18  9:48 PM   Result Value Ref Range    WBC 7.9 3.6 - 11.0 K/uL    RBC 3.92 3.80 - 5.20 M/uL    HGB 11.1 (L) 11.5 - 16.0 g/dL    HCT 16.1 (L) 09.6 - 47.0 %    MCV 85.7 80.0 - 99.0 FL    MCH 28.3 26.0 - 34.0 PG    MCHC 33.0 30.0 - 36.5 g/dL    RDW 04.5 (H) 40.9 - 14.5 %    PLATELET 186 150 - 400 K/uL    MPV 10.8 8.9 - 12.9 FL    NRBC 0.0 0 PER 100 WBC    ABSOLUTE NRBC 0.00 0.00 - 0.01 K/uL    NEUTROPHILS 59 32 - 75 %    LYMPHOCYTES 25 12 - 49 %    MONOCYTES 12 5 - 13 %    EOSINOPHILS 2 0 - 7 %    BASOPHILS 1 0 - 1 %     IMMATURE GRANULOCYTES 1 (H) 0.0 - 0.5 %    ABS. NEUTROPHILS 4.7 1.8 - 8.0 K/UL    ABS. LYMPHOCYTES 2.0 0.8 - 3.5 K/UL    ABS. MONOCYTES 1.0 0.0 - 1.0 K/UL    ABS. EOSINOPHILS 0.1 0.0 - 0.4 K/UL    ABS. BASOPHILS 0.0 0.0 - 0.1 K/UL    ABS. IMM. GRANS. 0.1 (H) 0.00 - 0.04 K/UL    DF AUTOMATED     SAMPLES BEING HELD    Collection Time: 11/30/18  9:48 PM   Result Value Ref Range    SAMPLES BEING HELD 1 red, 1 blue      COMMENT        Add-on orders for these samples will be processed based on acceptable specimen integrity and analyte stability, which may vary by analyte.       VITAL SIGNS:  Patient Vitals for the past 24 hrs:   BP Temp Pulse Resp SpO2   11/30/18 2025 (!) 142/99 98.8 ??F (37.1 ??C) (!) 104 16 96 %        RADIOLOGY RESULTS:  No results found.     DIAGNOSIS:  1. Acute cystitis without hematuria        PLAN:  Follow-up Information     Follow up With Specialties Details Why Contact Info    Follow-up with your urologist next Thursday as scheduled.               ED COURSE:

## 2018-11-30 NOTE — ED Notes (Signed)
Triage: Pt arrives from home with CC of pelvic pain and low back pain. [facility] and diagnosed with a kidney stone. She was seen by urology this week who told her the stone had passed to her bladder. She reports she has been taking percocet for her pain but does not like the way it makes her feel.

## 2018-11-30 NOTE — ED Provider Notes (Signed)
Sherri Gardner is a 48 y.o. female with past medical history notable for recent diagnosis of left obstructing ureteral stone, 5 mm, diagnosed 11/25/2018, onset of symptoms same day, seen by urology 11/27/2018, the stone was found to be in the bladder at that time.  She is now presenting with suprapubic pressure, burning with urination, frequency.  No further hematuria.  No difficulty voiding.  States that left flank pain is significantly improved.  No fevers, chills, vomiting.  + nausea.           No past medical history on file.    No past surgical history on file.      No family history on file.    Social History     Socioeconomic History   ??? Marital status: SINGLE     Spouse name: Not on file   ??? Number of children: Not on file   ??? Years of education: Not on file   ??? Highest education level: Not on file   Occupational History   ??? Not on file   Social Needs   ??? Financial resource strain: Not on file   ??? Food insecurity     Worry: Not on file     Inability: Not on file   ??? Transportation needs     Medical: Not on file     Non-medical: Not on file   Tobacco Use   ??? Smoking status: Not on file   Substance and Sexual Activity   ??? Alcohol use: Not on file   ??? Drug use: Not on file   ??? Sexual activity: Not on file   Lifestyle   ??? Physical activity     Days per week: Not on file     Minutes per session: Not on file   ??? Stress: Not on file   Relationships   ??? Social Wellsite geologistconnections     Talks on phone: Not on file     Gets together: Not on file     Attends religious service: Not on file     Active member of club or organization: Not on file     Attends meetings of clubs or organizations: Not on file     Relationship status: Not on file   ??? Intimate partner violence     Fear of current or ex partner: Not on file     Emotionally abused: Not on file     Physically abused: Not on file     Forced sexual activity: Not on file   Other Topics Concern   ??? Not on file   Social History Narrative   ??? Not on file          ALLERGIES: Patient has no known allergies.    Review of Systems   Constitutional: Negative for chills and fever.   Eyes: Negative for photophobia.   Respiratory: Negative for shortness of breath.    Cardiovascular: Negative for chest pain.   Gastrointestinal: Negative for abdominal pain.   Genitourinary: Negative for dysuria.   Musculoskeletal: Negative for back pain.   Neurological: Negative for headaches.   Psychiatric/Behavioral: Negative for confusion.   All other systems reviewed and are negative.      Vitals:    11/30/18 2025   BP: (!) 142/99   Pulse: (!) 104   Resp: 16   Temp: 98.8 ??F (37.1 ??C)   SpO2: 96%            Physical Exam  Vitals signs reviewed.  Constitutional:       General: She is not in acute distress.  HENT:      Head: Normocephalic and atraumatic.      Mouth/Throat:      Mouth: Mucous membranes are moist.      Pharynx: Oropharynx is clear.   Cardiovascular:      Rate and Rhythm: Normal rate and regular rhythm.      Heart sounds: Normal heart sounds.   Pulmonary:      Effort: Pulmonary effort is normal.      Breath sounds: Normal breath sounds.   Abdominal:      Tenderness: There is abdominal tenderness ( Mild suprapubic tenderness, no CVA tenderness). There is no right CVA tenderness, left CVA tenderness, guarding or rebound.   Musculoskeletal: Normal range of motion.      Right lower leg: No edema.      Left lower leg: No edema.   Skin:     General: Skin is warm and dry.      Capillary Refill: Capillary refill takes less than 2 seconds.   Neurological:      General: No focal deficit present.      Mental Status: She is alert and oriented to person, place, and time.   Psychiatric:         Mood and Affect: Mood normal.          MDM  Number of Diagnoses or Management Options  Acute cystitis without hematuria:   Diagnosis management comments: Symptoms and urinalysis consistent with UTI, no symptoms of renal colic at this point, urine sent for culture, has  follow-up with urology next Thursday.  Advised her if she has signs or symptoms of sepsis.  Vital signs reviewed.         Procedures    Chief Complaint   Patient presents with   ??? Pelvic Pain     MEDICATIONS GIVEN:  Medications   phenazopyridine (PYRIDIUM) tablet 200 mg (200 mg Oral Given 11/30/18 2151)   ketorolac (TORADOL) injection 15 mg (15 mg IntraVENous Given 11/30/18 2151)       LABS REVIEWED:  Recent Results (from the past 12 hour(s))   URINALYSIS W/MICROSCOPIC    Collection Time: 11/30/18  8:37 PM   Result Value Ref Range    Color YELLOW/STRAW      Appearance CLOUDY (A) CLEAR      Specific gravity 1.013 1.003 - 1.030      pH (UA) 7.0 5.0 - 8.0      Protein 30 (A) NEG mg/dL    Glucose Negative NEG mg/dL    Ketone Negative NEG mg/dL    Bilirubin Negative NEG      Blood SMALL (A) NEG      Urobilinogen 1.0 0.2 - 1.0 EU/dL    Nitrites Negative NEG      Leukocyte Esterase LARGE (A) NEG      WBC 50-100 0 - 4 /hpf    RBC 0-5 0 - 5 /hpf    Epithelial cells FEW FEW /lpf    Bacteria 2+ (A) NEG /hpf   URINE CULTURE HOLD SAMPLE    Collection Time: 11/30/18  8:37 PM   Result Value Ref Range    Urine culture hold        Urine on hold in Microbiology dept for 2 days.  If unpreserved urine is submitted, it cannot be used for addtional testing after 24 hours, recollection will be required.   HCG URINE, QL. - POC  Collection Time: 11/30/18  8:42 PM   Result Value Ref Range    Pregnancy test,urine (POC) Negative NEG     CBC WITH AUTOMATED DIFF    Collection Time: 11/30/18  9:48 PM   Result Value Ref Range    WBC 7.9 3.6 - 11.0 K/uL    RBC 3.92 3.80 - 5.20 M/uL    HGB 11.1 (L) 11.5 - 16.0 g/dL    HCT 09.7 (L) 35.3 - 47.0 %    MCV 85.7 80.0 - 99.0 FL    MCH 28.3 26.0 - 34.0 PG    MCHC 33.0 30.0 - 36.5 g/dL    RDW 29.9 (H) 24.2 - 14.5 %    PLATELET 186 150 - 400 K/uL    MPV 10.8 8.9 - 12.9 FL    NRBC 0.0 0 PER 100 WBC    ABSOLUTE NRBC 0.00 0.00 - 0.01 K/uL    NEUTROPHILS 59 32 - 75 %    LYMPHOCYTES 25 12 - 49 %     MONOCYTES 12 5 - 13 %    EOSINOPHILS 2 0 - 7 %    BASOPHILS 1 0 - 1 %    IMMATURE GRANULOCYTES 1 (H) 0.0 - 0.5 %    ABS. NEUTROPHILS 4.7 1.8 - 8.0 K/UL    ABS. LYMPHOCYTES 2.0 0.8 - 3.5 K/UL    ABS. MONOCYTES 1.0 0.0 - 1.0 K/UL    ABS. EOSINOPHILS 0.1 0.0 - 0.4 K/UL    ABS. BASOPHILS 0.0 0.0 - 0.1 K/UL    ABS. IMM. GRANS. 0.1 (H) 0.00 - 0.04 K/UL    DF AUTOMATED     SAMPLES BEING HELD    Collection Time: 11/30/18  9:48 PM   Result Value Ref Range    SAMPLES BEING HELD 1 red, 1 blue      COMMENT        Add-on orders for these samples will be processed based on acceptable specimen integrity and analyte stability, which may vary by analyte.       VITAL SIGNS:  Patient Vitals for the past 24 hrs:   BP Temp Pulse Resp SpO2   11/30/18 2025 (!) 142/99 98.8 ??F (37.1 ??C) (!) 104 16 96 %        RADIOLOGY RESULTS:  No results found.     DIAGNOSIS:  1. Acute cystitis without hematuria        PLAN:  Follow-up Information     Follow up With Specialties Details Why Contact Info    Follow-up with your urologist next Thursday as scheduled.               ED COURSE:

## 2018-11-30 NOTE — ED Triage Notes (Signed)
Triage: Pt arrives from home with CC of pelvic pain and low back pain. Pt reports [facility] and diagnosed with a kidney stone. She was seen by urology this week who told her the stone had passed to her bladder. She reports she has been taking percocet for her pain but does not like the way it makes her feel.

## 2018-12-01 ENCOUNTER — Inpatient Hospital Stay
Admit: 2018-12-01 | Discharge: 2018-12-01 | Disposition: A | Discharge status: Home / Self Care | Payer: MEDICAID | Attending: Emergency Medicine

## 2018-12-01 LAB — HCG URINE, QL. - POC
HCG, Pregnancy, Urine, POC: NEGATIVE
Pregnancy test,urine (POC): NEGATIVE

## 2018-12-01 LAB — METABOLIC PANEL, COMPREHENSIVE
A-G Ratio: 0.6 — ABNORMAL LOW (ref 1.1–2.2)
ALT (SGPT): 27 U/L (ref 12–78)
AST (SGOT): 23 U/L (ref 15–37)
Albumin: 2.7 g/dL — ABNORMAL LOW (ref 3.5–5.0)
Alk. phosphatase: 99 U/L (ref 45–117)
Anion gap: 9 mmol/L (ref 5–15)
BUN/Creatinine ratio: 12 (ref 12–20)
BUN: 14 MG/DL (ref 6–20)
Bilirubin, total: 0.5 MG/DL (ref 0.2–1.0)
CO2: 19 mmol/L — ABNORMAL LOW (ref 21–32)
Calcium: 8.5 MG/DL (ref 8.5–10.1)
Chloride: 111 mmol/L — ABNORMAL HIGH (ref 97–108)
Creatinine: 1.15 MG/DL — ABNORMAL HIGH (ref 0.55–1.02)
GFR est AA: >60 mL/min/{1.73_m2} (ref >60)
GFR est non-AA: 50 mL/min/{1.73_m2} — ABNORMAL LOW (ref >60)
Globulin: 4.2 g/dL — ABNORMAL HIGH (ref 2.0–4.0)
Glucose: 98 mg/dL (ref 65–100)
Potassium: 3.3 mmol/L — ABNORMAL LOW (ref 3.5–5.1)
Protein, total: 6.9 g/dL (ref 6.4–8.2)
Sodium: 139 mmol/L (ref 136–145)

## 2018-12-01 LAB — URINALYSIS W/MICROSCOPIC
Bilirubin: NEGATIVE
Glucose: NEGATIVE mg/dL
Ketone: NEGATIVE mg/dL
Nitrites: NEGATIVE
Protein: 30 mg/dL — AB
Specific gravity: 1.013 (ref 1.003–1.030)
Urobilinogen: 1 EU/dL (ref 0.2–1.0)
pH (UA): 7 (ref 5.0–8.0)

## 2018-12-01 LAB — URINE CULTURE HOLD SAMPLE

## 2018-12-01 LAB — CBC WITH AUTOMATED DIFF
ABS. BASOPHILS: 0 10*3/uL (ref 0.0–0.1)
ABS. EOSINOPHILS: 0.1 10*3/uL (ref 0.0–0.4)
ABS. IMM. GRANS.: 0.1 10*3/uL — ABNORMAL HIGH (ref 0.00–0.04)
ABS. LYMPHOCYTES: 2 10*3/uL (ref 0.8–3.5)
ABS. MONOCYTES: 1 10*3/uL (ref 0.0–1.0)
ABS. NEUTROPHILS: 4.7 10*3/uL (ref 1.8–8.0)
ABSOLUTE NRBC: 0 10*3/uL (ref 0.00–0.01)
BASOPHILS: 1 % (ref 0–1)
EOSINOPHILS: 2 % (ref 0–7)
HCT: 33.6 % — ABNORMAL LOW (ref 35.0–47.0)
HGB: 11.1 g/dL — ABNORMAL LOW (ref 11.5–16.0)
IMMATURE GRANULOCYTES: 1 % — ABNORMAL HIGH (ref 0.0–0.5)
LYMPHOCYTES: 25 % (ref 12–49)
MCH: 28.3 PG (ref 26.0–34.0)
MCHC: 33 g/dL (ref 30.0–36.5)
MCV: 85.7 FL (ref 80.0–99.0)
MONOCYTES: 12 % (ref 5–13)
MPV: 10.8 FL (ref 8.9–12.9)
NEUTROPHILS: 59 % (ref 32–75)
NRBC: 0 PER 100 WBC
PLATELET: 186 10*3/uL (ref 150–400)
RBC: 3.92 M/uL (ref 3.80–5.20)
RDW: 14.9 % — ABNORMAL HIGH (ref 11.5–14.5)
WBC: 7.9 10*3/uL (ref 3.6–11.0)

## 2018-12-01 LAB — SAMPLES BEING HELD: SAMPLES BEING HELD: 1

## 2018-12-01 LAB — CULTURE, URINE
Colonies Counted: >100000
Colony Count: >100000

## 2018-12-01 LAB — URINALYSIS WITH MICROSCOPIC
Bilirubin, Urine: NEGATIVE
Glucose, Ur: NEGATIVE mg/dL
Ketones, Urine: NEGATIVE mg/dL
Nitrite, Urine: NEGATIVE
Protein, UA: 30 mg/dL — AB
Specific Gravity, UA: 1.013 (ref 1.003–1.030)
Urobilinogen, UA, POCT: 1 EU/dL (ref 0.2–1.0)
pH, UA: 7 (ref 5.0–8.0)

## 2018-12-01 LAB — CBC WITH AUTO DIFFERENTIAL
Basophils %: 1 % (ref 0–1)
Basophils Absolute: 0 10*3/uL (ref 0.0–0.1)
Eosinophils %: 2 % (ref 0–7)
Eosinophils Absolute: 0.1 10*3/uL (ref 0.0–0.4)
Granulocyte Absolute Count: 0.1 10*3/uL — ABNORMAL HIGH (ref 0.00–0.04)
Hematocrit: 33.6 % — ABNORMAL LOW (ref 35.0–47.0)
Hemoglobin: 11.1 g/dL — ABNORMAL LOW (ref 11.5–16.0)
Immature Granulocytes: 1 % — ABNORMAL HIGH (ref 0.0–0.5)
Lymphocytes %: 25 % (ref 12–49)
Lymphocytes Absolute: 2 10*3/uL (ref 0.8–3.5)
MCH: 28.3 PG (ref 26.0–34.0)
MCHC: 33 g/dL (ref 30.0–36.5)
MCV: 85.7 FL (ref 80.0–99.0)
MPV: 10.8 FL (ref 8.9–12.9)
Monocytes %: 12 % (ref 5–13)
Monocytes Absolute: 1 10*3/uL (ref 0.0–1.0)
NRBC Absolute: 0 10*3/uL (ref 0.00–0.01)
Neutrophils %: 59 % (ref 32–75)
Neutrophils Absolute: 4.7 10*3/uL (ref 1.8–8.0)
Nucleated RBCs: 0 PER 100 WBC
Platelets: 186 10*3/uL (ref 150–400)
RBC: 3.92 M/uL (ref 3.80–5.20)
RDW: 14.9 % — ABNORMAL HIGH (ref 11.5–14.5)
WBC: 7.9 10*3/uL (ref 3.6–11.0)

## 2018-12-01 LAB — COMPREHENSIVE METABOLIC PANEL
ALT: 27 U/L (ref 12–78)
AST: 23 U/L (ref 15–37)
Albumin/Globulin Ratio: 0.6 — ABNORMAL LOW (ref 1.1–2.2)
Albumin: 2.7 g/dL — ABNORMAL LOW (ref 3.5–5.0)
Alkaline Phosphatase: 99 U/L (ref 45–117)
Anion Gap: 9 mmol/L (ref 5–15)
BUN: 14 MG/DL (ref 6–20)
Bun/Cre Ratio: 12 (ref 12–20)
CO2: 19 mmol/L — ABNORMAL LOW (ref 21–32)
Calcium: 8.5 MG/DL (ref 8.5–10.1)
Chloride: 111 mmol/L — ABNORMAL HIGH (ref 97–108)
Creatinine: 1.15 MG/DL — ABNORMAL HIGH (ref 0.55–1.02)
EGFR IF NonAfrican American: 50 mL/min/{1.73_m2} — ABNORMAL LOW (ref >60)
GFR African American: >60 mL/min/{1.73_m2} (ref >60)
Globulin: 4.2 g/dL — ABNORMAL HIGH (ref 2.0–4.0)
Glucose: 98 mg/dL (ref 65–100)
Potassium: 3.3 mmol/L — ABNORMAL LOW (ref 3.5–5.1)
Sodium: 139 mmol/L (ref 136–145)
Total Bilirubin: 0.5 MG/DL (ref 0.2–1.0)
Total Protein: 6.9 g/dL (ref 6.4–8.2)

## 2018-12-01 MED ORDER — PHENAZOPYRIDINE 100 MG TAB
100 mg | ORAL | Status: AC
Start: 2018-12-01 — End: 2018-11-30
  Administered 2018-12-01: 02:00:00 via ORAL

## 2018-12-01 MED ORDER — CEPHALEXIN 500 MG CAP
500 mg | ORAL_CAPSULE | Freq: Four times a day (QID) | ORAL | 0 refills | Status: AC
Start: 2018-12-01 — End: 2018-12-07

## 2018-12-01 MED ORDER — PHENAZOPYRIDINE 200 MG TAB
200 mg | ORAL_TABLET | Freq: Three times a day (TID) | ORAL | 0 refills | Status: AC
Start: 2018-12-01 — End: 2018-12-02

## 2018-12-01 MED ORDER — NAPROXEN 500 MG TAB
500 mg | ORAL_TABLET | Freq: Two times a day (BID) | ORAL | 0 refills | Status: AC
Start: 2018-12-01 — End: 2018-12-07

## 2018-12-01 MED ORDER — KETOROLAC TROMETHAMINE 30 MG/ML INJECTION
30 mg/mL (1 mL) | INTRAMUSCULAR | Status: AC
Start: 2018-12-01 — End: 2018-11-30
  Administered 2018-12-01: 02:00:00 via INTRAVENOUS

## 2018-12-01 MED FILL — PHENAZOPYRIDINE 100 MG TAB: 100 mg | ORAL | Qty: 2

## 2018-12-01 MED FILL — KETOROLAC TROMETHAMINE 30 MG/ML INJECTION: 30 mg/mL (1 mL) | INTRAMUSCULAR | Qty: 1

## 2019-08-04 NOTE — Interval H&P Note (Signed)
PATIENT CALLED AND MADE AWARE OF COVID-19 TESTING NEEDED TO BE DONE WITHIN 96 HOURS OF SURGERY. COVID-19 TESTING APPOINTMENT MADE FOR PATIENT.  PATIENT INSTRUCTED ON SELF QUARANTINE BETWEEN TESTING AND ARRIVAL TIME DAY OF SURGERY.    PT ALSO INFORMED TO NOT USE ANY NASAL SPRAYS OR NASAL OINTMENTS PRIOR TO NASAL SWAB.    **PT UNDERSTANDS TO QUARANTINE AN EXTRA DAY.

## 2019-08-04 NOTE — Other (Signed)
PATIENT CALLED AND MADE AWARE OF COVID-19 TESTING NEEDED TO BE DONE WITHIN 96 HOURS OF SURGERY. COVID-19 TESTING APPOINTMENT MADE FOR PATIENT.  PATIENT INSTRUCTED ON SELF QUARANTINE BETWEEN TESTING AND ARRIVAL TIME DAY OF SURGERY.    PT ALSO INFORMED TO NOT USE ANY NASAL SPRAYS OR NASAL OINTMENTS PRIOR TO NASAL SWAB.    **PT UNDERSTANDS TO QUARANTINE AN EXTRA DAY.

## 2019-08-07 ENCOUNTER — Encounter

## 2019-08-07 ENCOUNTER — Inpatient Hospital Stay: Admit: 2019-08-07 | Payer: MEDICAID | Primary: Family Medicine

## 2019-08-07 DIAGNOSIS — Z01812 Encounter for preprocedural laboratory examination: Secondary | ICD-10-CM

## 2019-08-08 LAB — NOVEL CORONAVIRUS (COVID-19): SARS-CoV-2: NOT DETECTED

## 2019-08-08 LAB — COVID-19: SARS-CoV-2: NOT DETECTED

## 2019-08-11 NOTE — H&P (Signed)
CC: Left hand pain and numbness    HPI: 49 year old female with clinical and electrodiagnostic evidence of bilateral carpal tunnel syndrome.  She has had surgery recently on the right side.  Doing well on that side.    She complains of pain and mechanical symptoms of the right middle finger.  Symptoms have started over the last week or so.  She also complains of numbness on the left hand.  She is interested in having surgery on the left hand while she is out of work from her right sided surgery    No current facility-administered medications on file prior to encounter.      No current outpatient medications on file prior to encounter.       No past medical history on file.    No Known Allergies    Social History     Socioeconomic History   ??? Marital status: SINGLE     Spouse name: Not on file   ??? Number of children: Not on file   ??? Years of education: Not on file   ??? Highest education level: Not on file   Occupational History   ??? Not on file   Social Needs   ??? Financial resource strain: Not on file   ??? Food insecurity     Worry: Not on file     Inability: Not on file   ??? Transportation needs     Medical: Not on file     Non-medical: Not on file   Tobacco Use   ??? Smoking status: Not on file   Substance and Sexual Activity   ??? Alcohol use: Not on file   ??? Drug use: Not on file   ??? Sexual activity: Not on file   Lifestyle   ??? Physical activity     Days per week: Not on file     Minutes per session: Not on file   ??? Stress: Not on file   Relationships   ??? Social Product manager on phone: Not on file     Gets together: Not on file     Attends religious service: Not on file     Active member of club or organization: Not on file     Attends meetings of clubs or organizations: Not on file     Relationship status: Not on file   ??? Intimate partner violence     Fear of current or ex partner: Not on file     Emotionally abused: Not on file     Physically abused: Not on file     Forced sexual activity: Not on file   Other  Topics Concern   ??? Not on file   Social History Narrative   ??? Not on file       No Known Allergies      Physical Exam  Constitutional:       Appearance: Normal appearance. She is normal weight.   HENT:      Head: Normocephalic.      Nose: Nose normal.   Eyes:      Pupils: Pupils are equal, round, and reactive to light.   Cardiovascular:      Rate and Rhythm: Normal rate.      Pulses: Normal pulses.   Pulmonary:      Effort: Pulmonary effort is normal.   Abdominal:      General: Abdomen is flat.      Palpations: Abdomen is soft.  Neurological:      Mental Status: She is alert.       ??She has a positive Tinel's over the carpal canal.  Positive carpal compression test.  Intact but altered median nerve sensibility.  Mild swelling.  Mild limitations in digital motion  She has triggering of the right middle finger.  Tenderness over the A1 pulley.  Large nodule.    Assessment:  Right middle finger trigger  Left carpal tunnel syndrome    Plan:  She would like to proceed with left endoscopic carpal tunnel release.  She has also developed a trigger digit on the right side.  I recommend a cortisone injection.  This can be done at the time of her left-sided surgery.  We discussed reasonable expectations.  We discussed usual postop course.  She has given informed consent to proceed.  We will keep her out of work until her next surgery.  ??

## 2019-08-11 NOTE — H&P (Signed)
CC: Left hand pain and numbness    HPI: 49 year old female with clinical and electrodiagnostic evidence of bilateral carpal tunnel syndrome.  She has had surgery recently on the right side.  Doing well on that side.    She complains of pain and mechanical symptoms of the right middle finger.  Symptoms have started over the last week or so.  She also complains of numbness on the left hand.  She is interested in having surgery on the left hand while she is out of work from her right sided surgery    No current facility-administered medications on file prior to encounter.      No current outpatient medications on file prior to encounter.       No past medical history on file.    No Known Allergies    Social History     Socioeconomic History   ??? Marital status: SINGLE     Spouse name: Not on file   ??? Number of children: Not on file   ??? Years of education: Not on file   ??? Highest education level: Not on file   Occupational History   ??? Not on file   Social Needs   ??? Financial resource strain: Not on file   ??? Food insecurity     Worry: Not on file     Inability: Not on file   ??? Transportation needs     Medical: Not on file     Non-medical: Not on file   Tobacco Use   ??? Smoking status: Not on file   Substance and Sexual Activity   ??? Alcohol use: Not on file   ??? Drug use: Not on file   ??? Sexual activity: Not on file   Lifestyle   ??? Physical activity     Days per week: Not on file     Minutes per session: Not on file   ??? Stress: Not on file   Relationships   ??? Social Wellsite geologist on phone: Not on file     Gets together: Not on file     Attends religious service: Not on file     Active member of club or organization: Not on file     Attends meetings of clubs or organizations: Not on file     Relationship status: Not on file   ??? Intimate partner violence     Fear of current or ex partner: Not on file     Emotionally abused: Not on file     Physically abused: Not on file     Forced sexual activity: Not on file    Other Topics Concern   ??? Not on file   Social History Narrative   ??? Not on file       No Known Allergies      Physical Exam  Constitutional:       Appearance: Normal appearance. She is normal weight.   HENT:      Head: Normocephalic.      Nose: Nose normal.   Eyes:      Pupils: Pupils are equal, round, and reactive to light.   Cardiovascular:      Rate and Rhythm: Normal rate.      Pulses: Normal pulses.   Pulmonary:      Effort: Pulmonary effort is normal.   Abdominal:      General: Abdomen is flat.      Palpations: Abdomen is soft.  Neurological:      Mental Status: She is alert.       ??She has a positive Tinel's over the carpal canal.  Positive carpal compression test.  Intact but altered median nerve sensibility.  Mild swelling.  Mild limitations in digital motion  She has triggering of the right middle finger.  Tenderness over the A1 pulley.  Large nodule.    Assessment:  Right middle finger trigger  Left carpal tunnel syndrome    Plan:  She would like to proceed with left endoscopic carpal tunnel release.  She has also developed a trigger digit on the right side.  I recommend a cortisone injection.  This can be done at the time of her left-sided surgery.  We discussed reasonable expectations.  We discussed usual postop course.  She has given informed consent to proceed.  We will keep her out of work until her next surgery.  ??

## 2019-08-12 ENCOUNTER — Inpatient Hospital Stay: Payer: MEDICAID

## 2019-08-12 LAB — HCG URINE, QL. - POC
HCG, Pregnancy, Urine, POC: NEGATIVE
Pregnancy test,urine (POC): NEGATIVE

## 2019-08-12 MED ORDER — SODIUM CHLORIDE 0.9 % IV
INTRAVENOUS | Status: DC
Start: 2019-08-12 — End: 2019-08-12

## 2019-08-12 MED ORDER — LIDOCAINE (PF) 10 MG/ML (1 %) IJ SOLN
10 mg/mL (1 %) | INTRAMUSCULAR | Status: DC | PRN
Start: 2019-08-12 — End: 2019-08-12

## 2019-08-12 MED ORDER — FENTANYL CITRATE (PF) 50 MCG/ML IJ SOLN
50 mcg/mL | INTRAMUSCULAR | Status: AC
Start: 2019-08-12 — End: ?

## 2019-08-12 MED ORDER — SODIUM CHLORIDE 0.9 % IJ SYRG
Freq: Three times a day (TID) | INTRAMUSCULAR | Status: DC
Start: 2019-08-12 — End: 2019-08-12

## 2019-08-12 MED ORDER — PROPOFOL 10 MG/ML IV EMUL
10 mg/mL | INTRAVENOUS | Status: DC | PRN
Start: 2019-08-12 — End: 2019-08-12
  Administered 2019-08-12 (×2): via INTRAVENOUS

## 2019-08-12 MED ORDER — OXYCODONE-ACETAMINOPHEN 5 MG-325 MG TAB
5-325 mg | ORAL | Status: DC | PRN
Start: 2019-08-12 — End: 2019-08-12

## 2019-08-12 MED ORDER — ONDANSETRON (PF) 4 MG/2 ML INJECTION
4 mg/2 mL | INTRAMUSCULAR | Status: DC | PRN
Start: 2019-08-12 — End: 2019-08-12

## 2019-08-12 MED ORDER — LIDOCAINE (PF) 20 MG/ML (2 %) IJ SOLN
20 mg/mL (2 %) | INTRAMUSCULAR | Status: DC | PRN
Start: 2019-08-12 — End: 2019-08-12
  Administered 2019-08-12: 13:00:00 via INTRAVENOUS

## 2019-08-12 MED ORDER — DIPHENHYDRAMINE HCL 50 MG/ML IJ SOLN
50 mg/mL | INTRAMUSCULAR | Status: DC | PRN
Start: 2019-08-12 — End: 2019-08-12

## 2019-08-12 MED ORDER — SODIUM CHLORIDE 0.9 % IJ SYRG
INTRAMUSCULAR | Status: DC | PRN
Start: 2019-08-12 — End: 2019-08-12

## 2019-08-12 MED ORDER — MIDAZOLAM 1 MG/ML IJ SOLN
1 mg/mL | INTRAMUSCULAR | Status: DC | PRN
Start: 2019-08-12 — End: 2019-08-12

## 2019-08-12 MED ORDER — ACETAMINOPHEN 325 MG TABLET
325 mg | Freq: Once | ORAL | Status: AC
Start: 2019-08-12 — End: 2019-08-12
  Administered 2019-08-12: 12:00:00 via ORAL

## 2019-08-12 MED ORDER — HYDROCODONE-ACETAMINOPHEN 5 MG-325 MG TAB
5-325 mg | ORAL_TABLET | ORAL | 0 refills | Status: AC | PRN
Start: 2019-08-12 — End: 2019-08-15

## 2019-08-12 MED ORDER — TRIAMCINOLONE ACETONIDE 40 MG/ML SUSP FOR INJECTION
40 mg/mL | INTRAMUSCULAR | Status: AC
Start: 2019-08-12 — End: ?

## 2019-08-12 MED ORDER — HYDROMORPHONE (PF) 1 MG/ML IJ SOLN
1 mg/mL | INTRAMUSCULAR | Status: DC | PRN
Start: 2019-08-12 — End: 2019-08-12

## 2019-08-12 MED ORDER — LACTATED RINGERS IV
INTRAVENOUS | Status: DC
Start: 2019-08-12 — End: 2019-08-12

## 2019-08-12 MED ORDER — FENTANYL CITRATE (PF) 50 MCG/ML IJ SOLN
50 mcg/mL | INTRAMUSCULAR | Status: DC | PRN
Start: 2019-08-12 — End: 2019-08-12

## 2019-08-12 MED ORDER — FENTANYL CITRATE (PF) 50 MCG/ML IJ SOLN
50 mcg/mL | INTRAMUSCULAR | Status: DC | PRN
Start: 2019-08-12 — End: 2019-08-12
  Administered 2019-08-12: 13:00:00 via INTRAVENOUS

## 2019-08-12 MED ORDER — DEXAMETHASONE SODIUM PHOSPHATE 4 MG/ML IJ SOLN
4 mg/mL | INTRAMUSCULAR | Status: AC
Start: 2019-08-12 — End: ?

## 2019-08-12 MED ORDER — MORPHINE 10 MG/ML INJ SOLUTION
10 mg/ml | INTRAMUSCULAR | Status: DC | PRN
Start: 2019-08-12 — End: 2019-08-12

## 2019-08-12 MED ORDER — MIDAZOLAM (PF) 1 MG/ML INJECTION SOLUTION
1 mg/mL | INTRAMUSCULAR | Status: AC
Start: 2019-08-12 — End: ?

## 2019-08-12 MED ORDER — SODIUM CHLORIDE 0.9 % IV
100 mcg/mL | INTRAVENOUS | Status: DC | PRN
Start: 2019-08-12 — End: 2019-08-12
  Administered 2019-08-12: 13:00:00 via INTRAVENOUS

## 2019-08-12 MED ORDER — BUPIVACAINE (PF) 0.5 % (5 MG/ML) IJ SOLN
0.5 % (5 mg/mL) | INTRAMUSCULAR | Status: DC | PRN
Start: 2019-08-12 — End: 2019-08-12
  Administered 2019-08-12: 13:00:00

## 2019-08-12 MED ORDER — LIDOCAINE HCL 2 % (20 MG/ML) IJ SOLN
20 mg/mL (2 %) | INTRAMUSCULAR | Status: AC
Start: 2019-08-12 — End: ?

## 2019-08-12 MED ORDER — ONDANSETRON (PF) 4 MG/2 ML INJECTION
4 mg/2 mL | INTRAMUSCULAR | Status: DC | PRN
Start: 2019-08-12 — End: 2019-08-12
  Administered 2019-08-12: 13:00:00 via INTRAVENOUS

## 2019-08-12 MED ORDER — BUPIVACAINE (PF) 0.5 % (5 MG/ML) IJ SOLN
0.5 % (5 mg/mL) | INTRAMUSCULAR | Status: AC
Start: 2019-08-12 — End: ?

## 2019-08-12 MED ORDER — ACETAMINOPHEN 325 MG TABLET
325 mg | ORAL | Status: AC
Start: 2019-08-12 — End: ?

## 2019-08-12 MED ORDER — LIDOCAINE (PF) 20 MG/ML (2 %) IJ SOLN
20 mg/mL (2 %) | INTRAMUSCULAR | Status: DC | PRN
Start: 2019-08-12 — End: 2019-08-12
  Administered 2019-08-12: 13:00:00 via SUBCUTANEOUS

## 2019-08-12 MED ORDER — LACTATED RINGERS IV
INTRAVENOUS | Status: DC
Start: 2019-08-12 — End: 2019-08-12
  Administered 2019-08-12: 12:00:00 via INTRAVENOUS

## 2019-08-12 MED ORDER — MEPERIDINE (PF) 25 MG/ML INJ SOLUTION
25 mg/ml | INTRAMUSCULAR | Status: DC | PRN
Start: 2019-08-12 — End: 2019-08-12

## 2019-08-12 MED FILL — DEXAMETHASONE SODIUM PHOSPHATE 4 MG/ML IJ SOLN: 4 mg/mL | INTRAMUSCULAR | Qty: 1

## 2019-08-12 MED FILL — TRIAMCINOLONE ACETONIDE 40 MG/ML SUSP FOR INJECTION: 40 mg/mL | INTRAMUSCULAR | Qty: 1

## 2019-08-12 MED FILL — NORMAL SALINE FLUSH 0.9 % INJECTION SYRINGE: INTRAMUSCULAR | Qty: 40

## 2019-08-12 MED FILL — FENTANYL CITRATE (PF) 50 MCG/ML IJ SOLN: 50 mcg/mL | INTRAMUSCULAR | Qty: 2

## 2019-08-12 MED FILL — LACTATED RINGERS IV: INTRAVENOUS | Qty: 1000

## 2019-08-12 MED FILL — SENSORCAINE-MPF 0.5 % (5 MG/ML) INJECTION SOLUTION: 0.5 % (5 mg/mL) | INTRAMUSCULAR | Qty: 60

## 2019-08-12 MED FILL — ACETAMINOPHEN 325 MG TABLET: 325 mg | ORAL | Qty: 2

## 2019-08-12 MED FILL — MIDAZOLAM (PF) 1 MG/ML INJECTION SOLUTION: 1 mg/mL | INTRAMUSCULAR | Qty: 2

## 2019-08-12 MED FILL — XYLOCAINE 20 MG/ML (2 %) INJECTION SOLUTION: 20 mg/mL (2 %) | INTRAMUSCULAR | Qty: 40

## 2019-08-12 MED FILL — SODIUM CHLORIDE 0.9 % IV: INTRAVENOUS | Qty: 1000

## 2019-08-12 NOTE — Op Note (Signed)
PREOPERATIVE DIAGNOSIS:   1. Left carpal tunnel syndrome.   2. Right middle finger trigger     POSTOPERATIVE DIAGNOSIS:   1. Left carpal tunnel syndrome.   2. Right middle finger trigger    PROCEDURES PERFORMED:   1. Endoscopic left carpal tunnel release.   2. Right middle finger trigger Injection    SURGEON: Zykee Avakian T. Jarman Litton, MD     ANESTHESIA: General.     ESTIMATED BLOOD LOSS: Minimal.     SPECIMENS REMOVED: None.     COMPLICATIONS: None.     IMPLANTS: None.     INDICATIONS FOR PROCEDURE: This is an 49 year-old female with   electrodiagnostic and clinical evidence of left carpal tunnel syndrome. She failed   conservative treatment, and wishes to proceed with endoscopic carpal tunnel   release. She is status post right carpal tunnel release and doing well on that side.  She also has developed pain and mechanical symptoms of the right middle finger consistent with a trigger finger.  After discussion of the risks, benefits, potential complications and alternatives to surgery, she has elected to proceed with left endoscopic carpal tunnel release and right middle finger trigger  injection.    DESCRIPTION OF PROCEDURE: The patient was identified in the preoperative   holding area. Informed consent was obtained, and the operative site was   marked. The patient was then transferred to the OR, and placed supine on the   operating table. After induction of general anesthesia, the right middle finger A1 pulley was injected with 20 mg Kenalog and 0.5 cc of 1% lidocaine.  The left upper   extremity was sterilely prepped and draped in the usual fashion. A surgical   time-out was held, and the operative site was confirmed. Preoperative   antibiotics were not given. After Esmarch exsanguination, the tourniquet was   elevated to 250 mmHg. A transverse incision was made just proximal to the   wrist flexion crease. Dissection was carried down through skin and   subcutaneous tissues, controlling bleeding with electrocautery. The    antebrachial fascia was incised sharply with a 15-blade. The proximal   aspect of the antebrachial fascia was then divided under direct   visualization. The carpal canal was entered - first with a synovial rasp,   and then serial dilators. The endoscope was placed.  Good visualization of   the transverse carpal ligament was easily obtained. The transverse carpal   ligament was then sharply divided, starting distally and working   proximally. The 50% distal aspect of the ligament was transected.  The   endoscope was then placed back into the carpal canal. Good release was   evident. The remaining 50% was then transected with the endoscope. The   endoscope was removed. The wound was thoroughly irrigated. The area of the   incision was anesthetized with 0.5% Marcaine. The wound was then closed   with 4-0 nylon sutures. Sterile dressings were applied.  The tourniquet was let down and brisk cap refill returned to the digits.  She tolerated the entire procedure well without complications.

## 2019-08-12 NOTE — Anesthesia Post-Procedure Evaluation (Signed)
Post-Anesthesia Evaluation and Assessment    Patient: Sherri Gardner MRN: [id number]  SSN: [id number]    Date of Birth: 1970-07-19  Age: 49 y.o.  Sex: female      I have evaluated the patient and they are stable and ready for discharge from the PACU.     Cardiovascular Function/Vital Signs  Visit Vitals  BP 102/71   Pulse 68   Temp 36.7 ??C (98.1 ??F)   Resp 13   Ht 5' 1.5" (1.562 m)   Wt 87.1 kg (192 lb)   SpO2 95%   BMI 35.69 kg/m??       Patient is status post General anesthesia for Procedure(s):  LEFT ENDOSCOPIC CARPAL TUNNEL RELEASE, RIGHT MIDDLE FINGER TRIGGER INJECTION.    Nausea/Vomiting: None    Postoperative hydration reviewed and adequate.    Pain:  Pain Scale 1: Numeric (0 - 10) (08/12/19 0756)  Pain Intensity 1: 0(Patient is unable to respond) (08/12/19 0756)   Managed    Neurological Status:   Neuro (WDL): Within Defined Limits (08/12/19 0810)  Neuro  Neurologic State: Drowsy;Eyes open spontaneously (08/12/19 0810)   At baseline    Mental Status, Level of Consciousness: Alert and  oriented to person, place, and time    Pulmonary Status:   O2 Device: Nasal cannula (08/12/19 0800)   Adequate oxygenation and airway patent    Complications related to anesthesia: None    Post-anesthesia assessment completed. No concerns    Signed By: Thane Edu, MD     August 12, 2019              Procedure(s):  LEFT ENDOSCOPIC CARPAL TUNNEL RELEASE, RIGHT MIDDLE FINGER TRIGGER INJECTION.    general    <BSHSIANPOST>    INITIAL Post-op Vital signs:   Vitals Value Taken Time   BP 108/75 08/12/19 0930   Temp 36.7 ??C (98.1 ??F) 08/12/19 0800   Pulse 68 08/12/19 0941   Resp 12 08/12/19 0941   SpO2 97 % 08/12/19 0941   Vitals shown include unvalidated device data.

## 2019-08-12 NOTE — Interval H&P Note (Signed)
Patient awake and alert and recently has the same surgery.  Discharge instructions reviewed with patient.  Opportunity for questions.  Verbalized understanding.  Paper copy given for home.

## 2019-08-12 NOTE — Anesthesia Pre-Procedure Evaluation (Signed)
Relevant Problems   No relevant active problems       Anesthetic History   No history of anesthetic complications            Review of Systems / Medical History  Patient summary reviewed, nursing notes reviewed and pertinent labs reviewed    Pulmonary  Within defined limits                 Neuro/Psych         Headaches     Cardiovascular  Within defined limits                Exercise tolerance: >4 METS     GI/Hepatic/Renal     GERD: well controlled           Endo/Other        Obesity     Other Findings   Comments: CTsynd  Trigger finger         Physical Exam    Airway  Mallampati: II  TM Distance: > 6 cm  Neck ROM: normal range of motion   Mouth opening: Normal     Cardiovascular  Regular rate and rhythm,  S1 and S2 normal,  no murmur, click, rub, or gallop             Dental    Dentition: Poor dentition     Pulmonary  Breath sounds clear to auscultation               Abdominal  GI exam deferred       Other Findings            Anesthetic Plan    ASA: 3  Anesthesia type: general          Induction: Intravenous  Anesthetic plan and risks discussed with: Patient

## 2019-08-12 NOTE — Op Note (Addendum)
PREOPERATIVE DIAGNOSIS:   1. Left carpal tunnel syndrome.   2. Right middle finger trigger     POSTOPERATIVE DIAGNOSIS:   1. Left carpal tunnel syndrome.   2. Right middle finger trigger    PROCEDURES PERFORMED:   1. Endoscopic left carpal tunnel release.   2. Right middle finger trigger Injection    SURGEON: Karas Pickerill T. Huckleberry Martinson, MD     ANESTHESIA: General.     ESTIMATED BLOOD LOSS: Minimal.     SPECIMENS REMOVED: None.     COMPLICATIONS: None.     IMPLANTS: None.     INDICATIONS FOR PROCEDURE: This is an 48 year-old female with   electrodiagnostic and clinical evidence of left carpal tunnel syndrome. She failed   conservative treatment, and wishes to proceed with endoscopic carpal tunnel   release. She is status post right carpal tunnel release and doing well on that side.  She also has developed pain and mechanical symptoms of the right middle finger consistent with a trigger finger.  After discussion of the risks, benefits, potential complications and alternatives to surgery, she has elected to proceed with left endoscopic carpal tunnel release and right middle finger trigger  injection.    DESCRIPTION OF PROCEDURE: The patient was identified in the preoperative   holding area. Informed consent was obtained, and the operative site was   marked. The patient was then transferred to the OR, and placed supine on the   operating table. After induction of general anesthesia, the right middle finger A1 pulley was injected with 20 mg Kenalog and 0.5 cc of 1% lidocaine.  The left upper   extremity was sterilely prepped and draped in the usual fashion. A surgical   time-out was held, and the operative site was confirmed. Preoperative   antibiotics were not given. After Esmarch exsanguination, the tourniquet was   elevated to 250 mmHg. A transverse incision was made just proximal to the   wrist flexion crease. Dissection was carried down through skin and   subcutaneous tissues, controlling bleeding with electrocautery. The    antebrachial fascia was incised sharply with a 15-blade. The proximal   aspect of the antebrachial fascia was then divided under direct   visualization. The carpal canal was entered - first with a synovial rasp,   and then serial dilators. The endoscope was placed.  Good visualization of   the transverse carpal ligament was easily obtained. The transverse carpal   ligament was then sharply divided, starting distally and working   proximally. The 50% distal aspect of the ligament was transected.  The   endoscope was then placed back into the carpal canal. Good release was   evident. The remaining 50% was then transected with the endoscope. The   endoscope was removed. The wound was thoroughly irrigated. The area of the   incision was anesthetized with 0.5% Marcaine. The wound was then closed   with 4-0 nylon sutures. Sterile dressings were applied.  The tourniquet was let down and brisk cap refill returned to the digits.  She tolerated the entire procedure well without complications.

## 2019-08-12 NOTE — Anesthesia Pre-Procedure Evaluation (Addendum)
Relevant Problems   No relevant active problems       Anesthetic History   No history of anesthetic complications            Review of Systems / Medical History  Patient summary reviewed, nursing notes reviewed and pertinent labs reviewed    Pulmonary  Within defined limits                 Neuro/Psych         Headaches     Cardiovascular  Within defined limits                Exercise tolerance: >4 METS     GI/Hepatic/Renal     GERD: well controlled           Endo/Other        Obesity     Other Findings   Comments: CTsynd  Trigger finger         Physical Exam    Airway  Mallampati: II  TM Distance: > 6 cm  Neck ROM: normal range of motion   Mouth opening: Normal     Cardiovascular  Regular rate and rhythm,  S1 and S2 normal,  no murmur, click, rub, or gallop             Dental    Dentition: Poor dentition     Pulmonary  Breath sounds clear to auscultation               Abdominal  GI exam deferred       Other Findings            Anesthetic Plan    ASA: 3  Anesthesia type: general          Induction: Intravenous  Anesthetic plan and risks discussed with: Patient

## 2019-08-12 NOTE — Other (Signed)
Patient awake and alert and recently has the same surgery.  Discharge instructions reviewed with patient.  Opportunity for questions.  Verbalized understanding.  Paper copy given for home.

## 2019-08-12 NOTE — Anesthesia Post-Procedure Evaluation (Signed)
Post-Anesthesia Evaluation and Assessment    Patient: Sherri Gardner MRN: [id number]  SSN: [id number]    Date of Birth: 01/31/1971  Age: 48 y.o.  Sex: female      I have evaluated the patient and they are stable and ready for discharge from the PACU.     Cardiovascular Function/Vital Signs  Visit Vitals  BP 102/71   Pulse 68   Temp 36.7 ??C (98.1 ??F)   Resp 13   Ht 5' 1.5" (1.562 m)   Wt 87.1 kg (192 lb)   SpO2 95%   BMI 35.69 kg/m??       Patient is status post General anesthesia for Procedure(s):  LEFT ENDOSCOPIC CARPAL TUNNEL RELEASE, RIGHT MIDDLE FINGER TRIGGER INJECTION.    Nausea/Vomiting: None    Postoperative hydration reviewed and adequate.    Pain:  Pain Scale 1: Numeric (0 - 10) (08/12/19 0756)  Pain Intensity 1: 0(Patient is unable to respond) (08/12/19 0756)   Managed    Neurological Status:   Neuro (WDL): Within Defined Limits (08/12/19 0810)  Neuro  Neurologic State: Drowsy;Eyes open spontaneously (08/12/19 0810)   At baseline    Mental Status, Level of Consciousness: Alert and  oriented to person, place, and time    Pulmonary Status:   O2 Device: Nasal cannula (08/12/19 0800)   Adequate oxygenation and airway patent    Complications related to anesthesia: None    Post-anesthesia assessment completed. No concerns    Signed By: Raevin Wierenga G Clifton Kovacic, MD     August 12, 2019              Procedure(s):  LEFT ENDOSCOPIC CARPAL TUNNEL RELEASE, RIGHT MIDDLE FINGER TRIGGER INJECTION.    general    <BSHSIANPOST>    INITIAL Post-op Vital signs:   Vitals Value Taken Time   BP 108/75 08/12/19 0930   Temp 36.7 ??C (98.1 ??F) 08/12/19 0800   Pulse 68 08/12/19 0941   Resp 12 08/12/19 0941   SpO2 97 % 08/12/19 0941   Vitals shown include unvalidated device data.

## 2019-08-12 NOTE — Other (Signed)
Patient: Sherri Gardner MRN: [id number]  SSN: [id number]   Date of Birth: [birth year]  Age: 49 y.o.  Sex: female     Patient is status post Procedure(s):  LEFT ENDOSCOPIC CARPAL TUNNEL RELEASE, RIGHT MIDDLE FINGER TRIGGER INJECTION.    Surgeon(s) and Role:     * Russ Halo, MD - Primary    Local/Dose/Irrigation:  SEE MAR                  Peripheral IV 08/12/19 Right Forearm (Active)   Site Assessment Clean, dry, & intact 08/12/19 0706   Dressing Status Clean, dry, & intact 08/12/19 0706   Dressing Type Transparent 08/12/19 0706            Airway - Endotracheal Tube 08/12/19 Oral (Active)                   Dressing/Packing:  Incision 08/12/19 Hand Left-Dressing/Treatment: Ace wrap;Cast;Gauze dressing/dressing sponge;Xeroform(bandaid to right middle finger injection site) (08/12/19 0746)    Splint/Cast:  ]    Other:

## 2019-08-12 NOTE — Interval H&P Note (Signed)
Update History & Physical    The Patient's History and Physical  was reviewed with the patient and I examined the patient.  There was no change.  The surgical site was confirmed by the patient and me.    Plan:  The risk, benefits, expected outcome, and alternative to the recommended procedure have been discussed with the patient.  Patient understands and wants to proceed with the procedure.    Electronically signed by Russ Halo, MD on 08/12/2019 at 7:22 AM

## 2019-11-05 ENCOUNTER — Inpatient Hospital Stay
Admit: 2019-11-05 | Discharge: 2019-11-05 | Disposition: A | Discharge status: Home / Self Care | Payer: MEDICAID | Attending: Emergency Medicine

## 2019-11-05 ENCOUNTER — Emergency Department: Admit: 2019-11-05 | Payer: MEDICAID | Primary: Family Medicine

## 2019-11-05 DIAGNOSIS — M25551 Pain in right hip: Secondary | ICD-10-CM

## 2019-11-05 LAB — CBC WITH AUTOMATED DIFF
ABS. BASOPHILS: 0.1 10*3/uL (ref 0.0–0.1)
ABS. EOSINOPHILS: 0.1 10*3/uL (ref 0.0–0.4)
ABS. IMM. GRANS.: 0 10*3/uL (ref 0.00–0.04)
ABS. LYMPHOCYTES: 3.5 10*3/uL (ref 0.8–3.5)
ABS. MONOCYTES: 0.4 10*3/uL (ref 0.0–1.0)
ABS. NEUTROPHILS: 4.3 10*3/uL (ref 1.8–8.0)
ABSOLUTE NRBC: 0 10*3/uL (ref 0.00–0.01)
BASOPHILS: 1 % (ref 0–1)
EOSINOPHILS: 2 % (ref 0–7)
HCT: 40.8 % (ref 35.0–47.0)
HGB: 13.4 g/dL (ref 11.5–16.0)
IMMATURE GRANULOCYTES: 0 % (ref 0.0–0.5)
LYMPHOCYTES: 42 % (ref 12–49)
MCH: 29.1 PG (ref 26.0–34.0)
MCHC: 32.8 g/dL (ref 30.0–36.5)
MCV: 88.7 FL (ref 80.0–99.0)
MONOCYTES: 5 % (ref 5–13)
MPV: 11.4 FL (ref 8.9–12.9)
NEUTROPHILS: 50 % (ref 32–75)
NRBC: 0 PER 100 WBC
PLATELET: 186 10*3/uL (ref 150–400)
RBC: 4.6 M/uL (ref 3.80–5.20)
RDW: 12.8 % (ref 11.5–14.5)
WBC: 8.4 10*3/uL (ref 3.6–11.0)

## 2019-11-05 LAB — URINALYSIS W/ REFLEX CULTURE
Bilirubin, Urine: NEGATIVE
Bilirubin: NEGATIVE
Blood, Urine: NEGATIVE
Blood: NEGATIVE
Glucose, Ur: NEGATIVE mg/dL
Glucose: NEGATIVE mg/dL
Ketone: NEGATIVE mg/dL
Ketones, Urine: NEGATIVE mg/dL
Leukocyte Esterase, Urine: NEGATIVE
Leukocyte Esterase: NEGATIVE
Nitrite, Urine: NEGATIVE
Nitrites: NEGATIVE
Protein, UA: NEGATIVE mg/dL
Protein: NEGATIVE mg/dL
Specific Gravity, UA: 1.02 (ref 1.003–1.030)
Specific gravity: 1.02 (ref 1.003–1.030)
Urobilinogen, UA, POCT: 0.2 EU/dL (ref 0.2–1.0)
Urobilinogen: 0.2 EU/dL (ref 0.2–1.0)
pH (UA): 6 (ref 5.0–8.0)
pH, UA: 6 (ref 5.0–8.0)

## 2019-11-05 LAB — METABOLIC PANEL, COMPREHENSIVE
A-G Ratio: 1.1 (ref 1.1–2.2)
ALT (SGPT): 16 U/L (ref 12–78)
AST (SGOT): 16 U/L (ref 15–37)
Albumin: 3.7 g/dL (ref 3.5–5.0)
Alk. phosphatase: 106 U/L (ref 45–117)
Anion gap: 3 mmol/L — ABNORMAL LOW (ref 5–15)
BUN/Creatinine ratio: 11 — ABNORMAL LOW (ref 12–20)
BUN: 14 MG/DL (ref 6–20)
Bilirubin, total: 0.4 MG/DL (ref 0.2–1.0)
CO2: 24 mmol/L (ref 21–32)
Calcium: 8.7 MG/DL (ref 8.5–10.1)
Chloride: 113 mmol/L — ABNORMAL HIGH (ref 97–108)
Creatinine: 1.22 MG/DL — ABNORMAL HIGH (ref 0.55–1.02)
GFR est AA: 57 mL/min/{1.73_m2} — ABNORMAL LOW (ref >60)
GFR est non-AA: 47 mL/min/{1.73_m2} — ABNORMAL LOW (ref >60)
Globulin: 3.4 g/dL (ref 2.0–4.0)
Glucose: 94 mg/dL (ref 65–100)
Potassium: 3.9 mmol/L (ref 3.5–5.1)
Protein, total: 7.1 g/dL (ref 6.4–8.2)
Sodium: 140 mmol/L (ref 136–145)

## 2019-11-05 LAB — SAMPLES BEING HELD

## 2019-11-05 LAB — HCG URINE, QL. - POC
HCG, Pregnancy, Urine, POC: NEGATIVE
Pregnancy test,urine (POC): NEGATIVE

## 2019-11-05 LAB — LIPASE
Lipase: 106 U/L (ref 73–393)
Lipase: 106 U/L (ref 73–393)

## 2019-11-05 LAB — COMPREHENSIVE METABOLIC PANEL
ALT: 16 U/L (ref 12–78)
AST: 16 U/L (ref 15–37)
Albumin/Globulin Ratio: 1.1 (ref 1.1–2.2)
Albumin: 3.7 g/dL (ref 3.5–5.0)
Alkaline Phosphatase: 106 U/L (ref 45–117)
Anion Gap: 3 mmol/L — ABNORMAL LOW (ref 5–15)
BUN: 14 MG/DL (ref 6–20)
Bun/Cre Ratio: 11 — ABNORMAL LOW (ref 12–20)
CO2: 24 mmol/L (ref 21–32)
Calcium: 8.7 MG/DL (ref 8.5–10.1)
Chloride: 113 mmol/L — ABNORMAL HIGH (ref 97–108)
Creatinine: 1.22 MG/DL — ABNORMAL HIGH (ref 0.55–1.02)
EGFR IF NonAfrican American: 47 mL/min/{1.73_m2} — ABNORMAL LOW (ref >60)
GFR African American: 57 mL/min/{1.73_m2} — ABNORMAL LOW (ref >60)
Globulin: 3.4 g/dL (ref 2.0–4.0)
Glucose: 94 mg/dL (ref 65–100)
Potassium: 3.9 mmol/L (ref 3.5–5.1)
Sodium: 140 mmol/L (ref 136–145)
Total Bilirubin: 0.4 MG/DL (ref 0.2–1.0)
Total Protein: 7.1 g/dL (ref 6.4–8.2)

## 2019-11-05 LAB — CBC WITH AUTO DIFFERENTIAL
Basophils %: 1 % (ref 0–1)
Basophils Absolute: 0.1 10*3/uL (ref 0.0–0.1)
Eosinophils %: 2 % (ref 0–7)
Eosinophils Absolute: 0.1 10*3/uL (ref 0.0–0.4)
Granulocyte Absolute Count: 0 10*3/uL (ref 0.00–0.04)
Hematocrit: 40.8 % (ref 35.0–47.0)
Hemoglobin: 13.4 g/dL (ref 11.5–16.0)
Immature Granulocytes: 0 % (ref 0.0–0.5)
Lymphocytes %: 42 % (ref 12–49)
Lymphocytes Absolute: 3.5 10*3/uL (ref 0.8–3.5)
MCH: 29.1 PG (ref 26.0–34.0)
MCHC: 32.8 g/dL (ref 30.0–36.5)
MCV: 88.7 FL (ref 80.0–99.0)
MPV: 11.4 FL (ref 8.9–12.9)
Monocytes %: 5 % (ref 5–13)
Monocytes Absolute: 0.4 10*3/uL (ref 0.0–1.0)
NRBC Absolute: 0 10*3/uL (ref 0.00–0.01)
Neutrophils %: 50 % (ref 32–75)
Neutrophils Absolute: 4.3 10*3/uL (ref 1.8–8.0)
Nucleated RBCs: 0 PER 100 WBC
Platelets: 186 10*3/uL (ref 150–400)
RBC: 4.6 M/uL (ref 3.80–5.20)
RDW: 12.8 % (ref 11.5–14.5)
WBC: 8.4 10*3/uL (ref 3.6–11.0)

## 2019-11-05 MED ORDER — SODIUM CHLORIDE 0.9% BOLUS IV
0.9 % | Freq: Once | INTRAVENOUS | Status: AC
Start: 2019-11-05 — End: 2019-11-05
  Administered 2019-11-05: 07:00:00 via INTRAVENOUS

## 2019-11-05 MED ORDER — CYCLOBENZAPRINE 10 MG TAB
10 mg | ORAL_TABLET | Freq: Two times a day (BID) | ORAL | 0 refills | Status: AC
Start: 2019-11-05 — End: ?

## 2019-11-05 MED ORDER — HYDROMORPHONE 0.5 MG/0.5 ML SYRINGE
0.5 mg/ mL | INTRAMUSCULAR | Status: AC
Start: 2019-11-05 — End: 2019-11-05
  Administered 2019-11-05: 07:00:00 via INTRAVENOUS

## 2019-11-05 MED ORDER — NAPROXEN 500 MG TAB
500 mg | ORAL_TABLET | Freq: Two times a day (BID) | ORAL | 0 refills | Status: AC
Start: 2019-11-05 — End: ?

## 2019-11-05 MED ORDER — KETOROLAC TROMETHAMINE 30 MG/ML INJECTION
30 mg/mL (1 mL) | INTRAMUSCULAR | Status: AC
Start: 2019-11-05 — End: 2019-11-05
  Administered 2019-11-05: 07:00:00 via INTRAVENOUS

## 2019-11-05 MED FILL — HYDROMORPHONE 0.5 MG/0.5 ML SYRINGE: 0.5 mg/ mL | INTRAMUSCULAR | Qty: 0.5

## 2019-11-05 MED FILL — KETOROLAC TROMETHAMINE 30 MG/ML INJECTION: 30 mg/mL (1 mL) | INTRAMUSCULAR | Qty: 1

## 2019-11-05 MED FILL — SODIUM CHLORIDE 0.9 % IV: INTRAVENOUS | Qty: 1000

## 2019-11-05 NOTE — ED Notes (Signed)
Dr. Mindi Slicker reviewed discharge instructions with the patient.  The patient verbalized understanding.  The patient was given opportunity for questions.  Patient discharged in stable condition to the waiting room via ambulatory.

## 2019-11-05 NOTE — ED Notes (Signed)
Verbal shift change report given to Roxanne (oncoming nurse) by Caitlin (offgoing nurse). Report included the following information SBAR, ED Summary, MAR and Recent Results.

## 2019-11-05 NOTE — ED Provider Notes (Signed)
The patient is a 48 year old female with a past medical history significant for GERD, migraine headache, hypothyroidism, status post C-section and kidney stones who presents to the ED with a complaint of right lower quadrant and flank discomfort that radiated to the right hip for approximately a week described as dull and throbbing in nature, severity 7 out of 10, constant and worse with movement, no relieving factors.  The patient was seen by PCP and had a negative evaluation for the same symptoms.  She has been taking over-the-counter medication without any relief or discomfort.  She denies any fever and chills, sore throat, cough or congestion, headache, neck or back pain, chest pain, shortness of breath, dysuria, hematuria, vaginal discharge or bleeding, extremity weakness or numbness, sick contact, skin rash, recent travel, prior history of the same.           Past Medical History:   Diagnosis Date   ??? GERD (gastroesophageal reflux disease)    ??? Ill-defined condition     migraine HA, leg neuropathy   ??? Thyroid disease        Past Surgical History:   Procedure Laterality Date   ??? HX GYN      csection   ??? HX ORTHOPAEDIC      right CTR   ??? HX UROLOGICAL      kidney stones         History reviewed. No pertinent family history.    Social History     Socioeconomic History   ??? Marital status: SINGLE     Spouse name: Not on file   ??? Number of children: Not on file   ??? Years of education: Not on file   ??? Highest education level: Not on file   Occupational History   ??? Not on file   Social Needs   ??? Financial resource strain: Not on file   ??? Food insecurity     Worry: Not on file     Inability: Not on file   ??? Transportation needs     Medical: Not on file     Non-medical: Not on file   Tobacco Use   ??? Smoking status: Current Every Day Smoker     Packs/day: 1.00   ??? Smokeless tobacco: Never Used   Substance and Sexual Activity   ??? Alcohol use: Not Currently   ??? Drug use: Not on file   ??? Sexual activity: Not on file    Lifestyle   ??? Physical activity     Days per week: Not on file     Minutes per session: Not on file   ??? Stress: Not on file   Relationships   ??? Social Product manager on phone: Not on file     Gets together: Not on file     Attends religious service: Not on file     Active member of club or organization: Not on file     Attends meetings of clubs or organizations: Not on file     Relationship status: Not on file   ??? Intimate partner violence     Fear of current or ex partner: Not on file     Emotionally abused: Not on file     Physically abused: Not on file     Forced sexual activity: Not on file   Other Topics Concern   ??? Not on file   Social History Narrative   ??? Not on file  ALLERGIES: Patient has no known allergies.    Review of Systems   All other systems reviewed and are negative.      Vitals:    11/05/19 0228   BP: (!) 164/120   Pulse: 71   Resp: 16   Temp: 98.1 ??F (36.7 ??C)   SpO2: 98%            Physical Exam  Vitals signs and nursing note reviewed. Exam conducted with a chaperone present.          CONSTITUTIONAL: Well-appearing; well-nourished; in mild distress  HEAD: Normocephalic; atraumatic  EYES: PERRL; EOM intact; conjunctiva and sclera are clear bilaterally.  ENT: No rhinorrhea; normal pharynx with no tonsillar hypertrophy; mucous membranes pink/moist, no erythema, no exudate.  NECK: Supple; non-tender; no cervical lymphadenopathy  CARD: Normal S1, S2; no murmurs, rubs, or gallops. Regular rate and rhythm.  RESP: Normal respiratory effort; breath sounds clear and equal bilaterally; no wheezes, rhonchi, or rales.  ABD: Normal bowel sounds; non-distended; non-tender; no palpable organomegaly, no masses, no bruits.  Back Exam: Normal inspection; no vertebral point tenderness, Right CVA tenderness. N decreased range of motion secondary to pain.  EXT: Normal ROM in all four extremities; non-tender to palpation; no swelling or deformity; distal pulses are normal, no edema.  SKIN: Warm; dry;  no rash.  NEURO:Alert and oriented x 3, coherent, NII-XII grossly intact, sensory and motor are non-focal.        MDM  Number of Diagnoses or Management Options  Diagnosis management comments: Assessment: 49 year old female who presents to the ED for evaluation for persistent right sided abdominal and hip pain.  Her pain is palpable reproducible on exam.  Differential diagnosis include kidney stone, myofascial strain, right hip arthropathy, diverticular disease, appendicitis, ovarian cyst/torsion    Plan: Lab/IV fluid/antiemetic and analgesia/CT scan of the abdomen and pelvis/education, reassurance, symptomatic treatment/serial exam/ Monitor and Reevaluate.         Amount and/or Complexity of Data Reviewed  Clinical lab tests: ordered and reviewed  Tests in the radiology section of CPT??: ordered and reviewed  Tests in the medicine section of CPT??: reviewed  Discussion of test results with the performing providers: yes  Decide to obtain previous medical records or to obtain history from someone other than the patient: yes  Obtain history from someone other than the patient: yes  Review and summarize past medical records: yes  Discuss the patient with other providers: yes  Independent visualization of images, tracings, or specimens: yes    Risk of Complications, Morbidity, and/or Mortality  Presenting problems: moderate  Diagnostic procedures: moderate  Management options: moderate    Patient Progress  Patient progress: stable         Procedures    Progress Note:   Pt has been reexamined by Loni Beckwith, MD. Pt is feeling much better. Symptoms have improved. All available results have been reviewed with pt and any available family. Pt understands sx, dx, and tx in ED. Care plan has been outlined and questions have been answered. Pt is ready to go home. Will send home on acute flank pain and right hip arthralgia instruction.  Prescription of naproxen and Flexeril.. Outpatient referral with PCP as needed. Written by  Loni Beckwith, MD,6:06 AM    .   .

## 2019-11-05 NOTE — ED Notes (Signed)
Triage: per EMS pt comes from work for lower right quad abd pain since the last week of April.

## 2019-11-05 NOTE — ED Notes (Signed)
Pt to CT via stretcher

## 2019-11-05 NOTE — ED Triage Notes (Signed)
Triage: per EMS pt comes from work for lower right quad abd pain since the last week of April.

## 2019-11-05 NOTE — ED Provider Notes (Signed)
The patient is a 49 year old female with a past medical history significant for GERD, migraine headache, hypothyroidism, status post C-section and kidney stones who presents to the ED with a complaint of right lower quadrant and flank discomfort that radiated to the right hip for approximately a week described as dull and throbbing in nature, severity 7 out of 10, constant and worse with movement, no relieving factors.  The patient was seen by PCP and had a negative evaluation for the same symptoms.  She has been taking over-the-counter medication without any relief or discomfort.  She denies any fever and chills, sore throat, cough or congestion, headache, neck or back pain, chest pain, shortness of breath, dysuria, hematuria, vaginal discharge or bleeding, extremity weakness or numbness, sick contact, skin rash, recent travel, prior history of the same.           Past Medical History:   Diagnosis Date   ??? GERD (gastroesophageal reflux disease)    ??? Ill-defined condition     migraine HA, leg neuropathy   ??? Thyroid disease        Past Surgical History:   Procedure Laterality Date   ??? HX GYN      csection   ??? HX ORTHOPAEDIC      right CTR   ??? HX UROLOGICAL      kidney stones         History reviewed. No pertinent family history.    Social History     Socioeconomic History   ??? Marital status: SINGLE     Spouse name: Not on file   ??? Number of children: Not on file   ??? Years of education: Not on file   ??? Highest education level: Not on file   Occupational History   ??? Not on file   Social Needs   ??? Financial resource strain: Not on file   ??? Food insecurity     Worry: Not on file     Inability: Not on file   ??? Transportation needs     Medical: Not on file     Non-medical: Not on file   Tobacco Use   ??? Smoking status: Current Every Day Smoker     Packs/day: 1.00   ??? Smokeless tobacco: Never Used   Substance and Sexual Activity   ??? Alcohol use: Not Currently   ??? Drug use: Not on file   ??? Sexual activity: Not on file    Lifestyle   ??? Physical activity     Days per week: Not on file     Minutes per session: Not on file   ??? Stress: Not on file   Relationships   ??? Social Product manager on phone: Not on file     Gets together: Not on file     Attends religious service: Not on file     Active member of club or organization: Not on file     Attends meetings of clubs or organizations: Not on file     Relationship status: Not on file   ??? Intimate partner violence     Fear of current or ex partner: Not on file     Emotionally abused: Not on file     Physically abused: Not on file     Forced sexual activity: Not on file   Other Topics Concern   ??? Not on file   Social History Narrative   ??? Not on file  ALLERGIES: Patient has no known allergies.    Review of Systems   All other systems reviewed and are negative.      Vitals:    11/05/19 0228   BP: (!) 164/120   Pulse: 71   Resp: 16   Temp: 98.1 ??F (36.7 ??C)   SpO2: 98%            Physical Exam  Vitals signs and nursing note reviewed. Exam conducted with a chaperone present.          CONSTITUTIONAL: Well-appearing; well-nourished; in mild distress  HEAD: Normocephalic; atraumatic  EYES: PERRL; EOM intact; conjunctiva and sclera are clear bilaterally.  ENT: No rhinorrhea; normal pharynx with no tonsillar hypertrophy; mucous membranes pink/moist, no erythema, no exudate.  NECK: Supple; non-tender; no cervical lymphadenopathy  CARD: Normal S1, S2; no murmurs, rubs, or gallops. Regular rate and rhythm.  RESP: Normal respiratory effort; breath sounds clear and equal bilaterally; no wheezes, rhonchi, or rales.  ABD: Normal bowel sounds; non-distended; non-tender; no palpable organomegaly, no masses, no bruits.  Back Exam: Normal inspection; no vertebral point tenderness, Right CVA tenderness. N decreased range of motion secondary to pain.  EXT: Normal ROM in all four extremities; non-tender to palpation; no swelling or deformity; distal pulses are normal, no edema.  SKIN: Warm; dry; no  rash.  NEURO:Alert and oriented x 3, coherent, NII-XII grossly intact, sensory and motor are non-focal.        MDM  Number of Diagnoses or Management Options  Diagnosis management comments: Assessment: 49 year old female who presents to the ED for evaluation for persistent right sided abdominal and hip pain.  Her pain is palpable reproducible on exam.  Differential diagnosis include kidney stone, myofascial strain, right hip arthropathy, diverticular disease, appendicitis, ovarian cyst/torsion    Plan: Lab/IV fluid/antiemetic and analgesia/CT scan of the abdomen and pelvis/education, reassurance, symptomatic treatment/serial exam/ Monitor and Reevaluate.         Amount and/or Complexity of Data Reviewed  Clinical lab tests: ordered and reviewed  Tests in the radiology section of CPT??: ordered and reviewed  Tests in the medicine section of CPT??: reviewed  Discussion of test results with the performing providers: yes  Decide to obtain previous medical records or to obtain history from someone other than the patient: yes  Obtain history from someone other than the patient: yes  Review and summarize past medical records: yes  Discuss the patient with other providers: yes  Independent visualization of images, tracings, or specimens: yes    Risk of Complications, Morbidity, and/or Mortality  Presenting problems: moderate  Diagnostic procedures: moderate  Management options: moderate    Patient Progress  Patient progress: stable         Procedures    Progress Note:   Pt has been reexamined by Loni Beckwith, MD. Pt is feeling much better. Symptoms have improved. All available results have been reviewed with pt and any available family. Pt understands sx, dx, and tx in ED. Care plan has been outlined and questions have been answered. Pt is ready to go home. Will send home on acute flank pain and right hip arthralgia instruction.  Prescription of naproxen and Flexeril.. Outpatient referral with PCP as needed. Written by  Loni Beckwith, MD,6:06 AM    .   .

## 2019-11-05 NOTE — ED Notes (Signed)
Dr. Azie reviewed discharge instructions with the patient.  The patient verbalized understanding.  The patient was given opportunity for questions.  Patient discharged in stable condition to the waiting room via ambulatory.

## 2022-05-09 ENCOUNTER — Inpatient Hospital Stay: Payer: Medicaid (Managed Care) | Attending: Specialist

## 2022-05-09 MED ORDER — SODIUM CHLORIDE 0.9 % IV SOLN
0.9 % | INTRAVENOUS | Status: DC | PRN
Start: 2022-05-09 — End: 2022-05-09
  Administered 2022-05-09: 18:00:00 via INTRAVENOUS

## 2022-05-09 MED ORDER — NORMAL SALINE FLUSH 0.9 % IV SOLN
0.9 % | INTRAVENOUS | Status: DC | PRN
Start: 2022-05-09 — End: 2022-05-09

## 2022-05-09 MED ORDER — SODIUM CHLORIDE 0.9 % IV SOLN
0.9 % | INTRAVENOUS | Status: DC
Start: 2022-05-09 — End: 2022-05-09

## 2022-05-09 MED ORDER — NORMAL SALINE FLUSH 0.9 % IV SOLN
0.9 % | Freq: Two times a day (BID) | INTRAVENOUS | Status: DC
Start: 2022-05-09 — End: 2022-05-09

## 2022-05-09 MED ORDER — LIDOCAINE HCL (PF) 2 % IJ SOLN
2 % | INTRAMUSCULAR | Status: DC | PRN
Start: 2022-05-09 — End: 2022-05-09
  Administered 2022-05-09: 18:00:00 100 via INTRAVENOUS

## 2022-05-09 MED ORDER — PROPOFOL 100 MG/10ML IV EMUL
100 MG/10ML | INTRAVENOUS | Status: DC | PRN
Start: 2022-05-09 — End: 2022-05-09
  Administered 2022-05-09 (×3): 50 via INTRAVENOUS
  Administered 2022-05-09: 18:00:00 100 via INTRAVENOUS
  Administered 2022-05-09: 18:00:00 50 via INTRAVENOUS

## 2022-05-09 MED FILL — NORMAL SALINE FLUSH 0.9 % IV SOLN: 0.9 % | INTRAVENOUS | Qty: 40

## 2022-05-09 MED FILL — SODIUM CHLORIDE 0.9 % IV SOLN: 0.9 % | INTRAVENOUS | Qty: 1000

## 2022-05-09 NOTE — Discharge Instructions (Signed)
Sherri Gardner  [id number]  July 04, 1970    EGD DISCHARGE INSTRUCTIONS  Discomfort:  Sore throat- throat lozenges or warm salt water gargle  redness at IV site- apply warm compress to area; if redness or soreness persist- contact your physician  Gaseous discomfort- walking, belching will help relieve any discomfort    DIET  You may resume your regular diet - however -  remember your colon is empty and a heavy meal will produce gas.   Avoid these foods:  vegetables, fried / greasy foods, carbonated drinks  You may not drink alcoholic beverages for at least 12 hours    MEDICATIONS   Regarding Aspirin or Nonsteroidal medications specifically, please see below.    ACTIVITY  You may resume your normal daily activities.   Spend the remainder of the day resting -  avoid any strenuous activity.  You may not operate a vehicle for 12 hours  You may not engage in an occupation involving machinery or appliances for rest of today.  Avoid making any critical decisions for at least 24 hour    CALL M.D.  ANY SIGN OF   Increasing pain, nausea, vomiting  Abdominal distension (swelling)  New increased bleeding (oral or rectal)  Fever (chills)  Pain in chest area  Bloody discharge from nose or mouth  Shortness of breath    You may  take any Advil, Aspirin, Ibuprofen, Motrin, Aleve, or Goody's for 10 days, ONLY  Tylenol as needed for pain.    Post procedure diagnosis: Barrett esophagus  Post-procedure recommendations: Await biopsy results    Follow-up Instructions:   Call Dr. Dwyane Dee  Results of procedure / biopsy in 10 days  Telephone #  [phone]        DISCHARGE SUMMARY from Nurse    The following personal items collected during your admission are returned to you:   Dental Appliance:    Vision:    Hearing Aid:    Jewelry:    Clothing:    Other Valuables:    Valuables sent to safe: Dose (mL/hr) Propofol : *50 mg

## 2022-05-09 NOTE — Progress Notes (Signed)
Initial RN admission and assessment performed and documented in Endoscopy navigator.     Patient evaluated by anesthesia in pre-procedure holding.     All procedural vital signs, airway assessment, and level of consciousness information monitored and recorded by anesthesia staff on the anesthesia record.     Report received from CRNA post procedure.  Patient transported to recovery area by RN.    Endoscope was pre-cleaned at bedside immediately following procedure by Jerrod.

## 2022-05-09 NOTE — Progress Notes (Signed)
Endoscopy recovery  Patient returned to baseline, vital signs stable (see vital sign flowsheet). Patient offered liquids and tolerated well. Respiratory status within defined limits. Abdomen soft not tender. Skin with in defined limits. Responsible party driving patient home was given the opportunity to ask questions. Patient discharged with documented belongings. Discharge instructions reviewed and print out given.  Patient verbalized understanding.

## 2022-05-09 NOTE — Op Note (Signed)
[place]  [place], [place]                 NAME:  Sherri Gardner   DOB:   January 05, 1971   MRN:   [id number]     Date/Time:  05/09/2022 12:59 PM    Esophagogastroduodenoscopy (EGD) Procedure Note    Operator:  Inez Pilgrim, MD    Staff: Circulator: Ulice Dash, RN  Endoscopy Technician: Renelda Loma     Referring Provider:  No primary care provider on file.    Anethesia/Sedation:  MAC anesthesia Propofol    Preoperative diagnosis: Barrett's esophagus without dysplasia [K22.70], dysphagia      Procedure Details     After infom consent was obtained for the procedure, with all risks and benefits of procedure explained the patient was taken to the endoscopy suite and placed in the left lateral decubitus position.  Following sequential administration of sedation as per above, the GIFH190 gastroscope was inserted into the mouth and advanced under direct vision to second portion of the duodenum.  A careful inspection was made as the gastroscope was withdrawn, including a retroflexed view of the proximal stomach; findings and interventions are described below.      Findings:  Esophagus:Irregular Z line seen at 35 cm, biopsies obtained to rule out Barrett esophagus, esophagus dilated with 68 F wire guided Savary dilator  Stomach:normal   Duodenum/jejunum:normal      Therapies:  As above    Specimens: distal esophagus bx           EBL: None    Complications:   None; patient tolerated the procedure well.           Impression:    See Postoperative diagnosis above    Recommendations:  -Acid suppression with a proton pump inhibitor., -Await pathology.    Discharge disposition:  Home in the company of driver when able to ambulate    Inez Pilgrim, MD

## 2022-05-09 NOTE — Anesthesia Post-Procedure Evaluation (Signed)
Department of Anesthesiology  Postprocedure Note    Patient: Sherri Gardner  MRN: [id number]  Birthdate: [birth year]  Date of evaluation: 05/09/2022      Procedure Summary     Date: 05/09/22 Room / Location: [facility] ENDO 03 / [facility]COPY    Anesthesia Start: 1238 Anesthesia Stop: 1253    Procedure: EGD ESOPHAGOGASTRODUODENOSCOPY Diagnosis:       Barrett's esophagus without dysplasia      (Barrett's esophagus without dysplasia [K22.70])    Surgeons: Inez Pilgrim, MD Responsible Provider: Irene Pap, MD    Anesthesia Type: MAC ASA Status: 2          Anesthesia Type: MAC    Aldrete Phase I: Aldrete Score: 10    Aldrete Phase II: Aldrete Score: 9      Anesthesia Post Evaluation    Patient location during evaluation: PACU  Patient participation: complete - patient participated  Level of consciousness: responsive to verbal stimuli and sleepy but conscious  Airway patency: patent  Complications: no  Cardiovascular status: blood pressure returned to baseline  Respiratory status: acceptable  Hydration status: stable  Comments: +Post-Anesthesia Evaluation and Assessment    Patient: [name] MRN: [id number]  SSN: [id number]   Date of Birth: 13-Feb-1971  Age: 51 y.o.  Sex: female          Cardiovascular Function/Vital Signs    BP 107/63   Pulse 65   Temp 98.3 F (36.8 C) (Temporal)   Resp 12   Ht 1.549 m (_0 )   Wt 79.4 kg (175 lb)   SpO2 97%   BMI 33.07 kg/m     Patient is status post Procedure(s):  EGD ESOPHAGOGASTRODUODENOSCOPY.    Nausea/Vomiting: Controlled.    Postoperative hydration reviewed and adequate.    Pain:      Managed.    Neurological Status:       At baseline.    Mental Status and Level of Consciousness: Arousable.    Pulmonary Status:       Adequate oxygenation and airway patent.    Complications related to anesthesia: None    Post-anesthesia assessment completed. No concerns.    I have evaluated the patient and the patient is stable and ready to be discharged from PACU .    Signed By: Lenell Antu,  MD    05/09/2022    Pain management: satisfactory to patient

## 2022-05-09 NOTE — H&P (Signed)
Pre-endoscopy H and P     The patient was seen and examined in the endoscopy suite. The airway was assessed and docuemented. The problem list and medications were reviewed.     There is no problem list on file for this patient.    Social History     Socioeconomic History    Marital status: Single     Spouse name: Not on file    Number of children: Not on file    Years of education: Not on file    Highest education level: Not on file   Occupational History    Not on file   Tobacco Use    Smoking status: Every Day     Packs/day: 1     Types: Cigarettes    Smokeless tobacco: Never   Substance and Sexual Activity    Alcohol use: Not Currently    Drug use: Not on file    Sexual activity: Not on file   Other Topics Concern    Not on file   Social History Narrative    Not on file     Social Determinants of Health     Financial Resource Strain: Not on file   Food Insecurity: Not on file   Transportation Needs: Not on file   Physical Activity: Not on file   Stress: Not on file   Social Connections: Not on file   Intimate Partner Violence: Not on file   Housing Stability: Not on file     Past Medical History:   Diagnosis Date    Barrett's esophagus     GERD (gastroesophageal reflux disease)     Hypothyroid     Migraines     Neuropathy          Prior to Admission Medications   Prescriptions Last Dose Informant Patient Reported? Taking?   Atorvastatin Calcium (LIPITOR PO)   Yes Yes   Sig: Take by mouth   SUMAtriptan (IMITREX) 50 MG tablet   Yes Yes   Sig: TAKE 1 TABLET EVERY 4 HOURS AS NEEDED FOR MIGRAINE. MAXIMUM OF 2 TABLETS PER DAY AND 10 PER MONTH   cetirizine (ZYRTEC) 10 MG tablet   Yes Yes   Sig: Take 1 tablet by mouth daily   cyclobenzaprine (FLEXERIL) 10 MG tablet   Yes Yes   diclofenac sodium (VOLTAREN) 1 % GEL   Yes Yes   Sig: USE AS DIRECTED EXTERNALLY TO AFFECTED AREA(S) 1 TO 2 TIMES A DAY AS NEEDED FOR PAIN 30 DAYS   gabapentin (NEURONTIN) 300 MG capsule   Yes Yes   hydrOXYzine HCl (ATARAX) 25 MG tablet   Yes Yes    Sig: Take 1 tablet by mouth every 8 hours as needed   levothyroxine (SYNTHROID) 25 MCG tablet   Yes Yes   pantoprazole (PROTONIX) 40 MG tablet   Yes Yes   topiramate (TOPAMAX) 100 MG tablet   Yes Yes      Facility-Administered Medications: None       Chief complaint, history of present illness, and review of systems and Past medical History are positive for: GERD, dysphagia    The heart, lungs and mental status were satisfactory for the administration of sedation and for the procedure.     I discussed with the patient the objectives, risks, consequences and alternatives to the procedure.     The patient was counseled at length about the risks of contracting Covid-19 in the peri-operative and post-operative states including the recovery window of their  procedure.  The patient was made aware that contracting Covid-19 after a surgical procedure may worsen their prognosis for recovering from the virus and lend to a higher morbidity and or mortality risk.  The patient was given the options of postponing their procedure. All of the risks, benefits, and alternatives were discussed. The patient does wish to proceed with the procedure.    Plan: Endoscopic procedure with sedation     Alma Downs, MD   05/09/2022  12:43 PM

## 2022-05-09 NOTE — Anesthesia Pre-Procedure Evaluation (Signed)
Department of Anesthesiology  Preprocedure Note       Name:  Sherri Gardner   Age:  51 y.o.  DOB:  10-Aug-1970                                          MRN:  [id number]         Date:  05/09/2022      Surgeon: Juliann Mule):  Inez Pilgrim, MD    Procedure: Procedure(s):  EGD ESOPHAGOGASTRODUODENOSCOPY    Medications prior to admission:   Prior to Admission medications    Medication Sig Start Date End Date Taking? Authorizing Provider   cetirizine (ZYRTEC) 10 MG tablet Take 1 tablet by mouth daily 08/31/19  Yes [provider]   cyclobenzaprine (FLEXERIL) 10 MG tablet  05/06/19  Yes [provider]   diclofenac sodium (VOLTAREN) 1 % GEL USE AS DIRECTED EXTERNALLY TO AFFECTED AREA(S) 1 TO 2 TIMES A DAY AS NEEDED FOR PAIN 30 DAYS 09/13/20  Yes [provider]   gabapentin (NEURONTIN) 300 MG capsule  05/15/19  Yes [provider]   hydrOXYzine HCl (ATARAX) 25 MG tablet Take 1 tablet by mouth every 8 hours as needed 09/13/20  Yes [provider]   levothyroxine (SYNTHROID) 25 MCG tablet  05/06/19  Yes [provider]   pantoprazole (PROTONIX) 40 MG tablet  05/06/19  Yes [provider]   SUMAtriptan (IMITREX) 50 MG tablet TAKE 1 TABLET EVERY 4 HOURS AS NEEDED FOR MIGRAINE. MAXIMUM OF 2 TABLETS PER DAY AND 10 PER MONTH 10/30/19  Yes [provider]   topiramate (TOPAMAX) 100 MG tablet  03/26/19  Yes [provider]   Atorvastatin Calcium (LIPITOR PO) Take by mouth   Yes [provider]       Current medications:    No current facility-administered medications for this encounter.       Allergies:    Allergies   Allergen Reactions   . Aspirin Other (See Comments)       Problem List:  There is no problem list on file for this patient.      Past Medical History:        Diagnosis Date   . Barrett's esophagus    . GERD (gastroesophageal reflux disease)    . Hypothyroid    . Migraines    . Neuropathy        Past Surgical History:        Procedure Laterality  Date   . CARPAL TUNNEL RELEASE Bilateral    . CESAREAN SECTION     . LITHOTRIPSY         Social History:    Social History     Tobacco Use   . Smoking status: Every Day     Packs/day: 1     Types: Cigarettes   . Smokeless tobacco: Never   Substance Use Topics   . Alcohol use: Not Currently                                Ready to quit: Not Answered  Counseling given: Not Answered      Vital Signs (Current):   Vitals:    05/09/22 1130   BP: 139/78   Pulse: 68   Resp: 15   SpO2: 97%   Weight: 79.4 kg (175  lb)   Height: 1.549 m (_0 )                                              BP Readings from Last 3 Encounters:   05/09/22 139/78       NPO Status:                                                   Date of last liquid consumption: 05/08/22                        Date of last solid food consumption: 05/08/22    BMI:   Wt Readings from Last 3 Encounters:   05/09/22 79.4 kg (175 lb)     Body mass index is 33.07 kg/m.    CBC:   Lab Results   Component Value Date/Time    WBC 8.4 11/05/2019 02:35 AM    RBC 4.60 11/05/2019 02:35 AM    HGB 13.4 11/05/2019 02:35 AM    HCT 40.8 11/05/2019 02:35 AM    MCV 88.7 11/05/2019 02:35 AM    RDW 12.8 11/05/2019 02:35 AM    PLT 186 11/05/2019 02:35 AM       CMP:   Lab Results   Component Value Date/Time    NA 140 11/05/2019 02:35 AM    K 3.9 11/05/2019 02:35 AM    CL 113 11/05/2019 02:35 AM    CO2 24 11/05/2019 02:35 AM    BUN 14 11/05/2019 02:35 AM    CREATININE 1.22 11/05/2019 02:35 AM    GFRAA 57 11/05/2019 02:35 AM    AGRATIO 1.1 11/05/2019 02:35 AM    GLUCOSE 94 11/05/2019 02:35 AM    PROT 7.1 11/05/2019 02:35 AM    CALCIUM 8.7 11/05/2019 02:35 AM    BILITOT 0.4 11/05/2019 02:35 AM    ALKPHOS 106 11/05/2019 02:35 AM    AST 16 11/05/2019 02:35 AM    ALT 16 11/05/2019 02:35 AM       POC Tests: No results for input(s): "POCGLU", "POCNA", "POCK", "POCCL", "POCBUN", "POCHEMO", "POCHCT" in the last 72 hours.    Coags: No results found for: "PROTIME", "INR", "APTT"    HCG (If Applicable):  No results found for: "PREGTESTUR", "PREGSERUM", "HCG", "HCGQUANT"     ABGs: No results found for: "PHART", "PO2ART", "PCO2ART", "HCO3ART", "BEART", "O2SATART"     Type & Screen (If Applicable):  No results found for: "LABABO", "LABRH"    Drug/Infectious Status (If Applicable):  No results found for: "HIV", "HEPCAB"    COVID-19 Screening (If Applicable):   Lab Results   Component Value Date/Time    COVID19 Not Detected 08/07/2019 12:20 PM           Anesthesia Evaluation  Patient summary reviewed and Nursing notes reviewed no history of anesthetic complications:   Airway: Mallampati: III  TM distance: >3 FB   Neck ROM: full  Mouth opening: > = 3 FB   Dental: normal exam   (+) caps      Pulmonary:Negative Pulmonary ROS and normal exam  breath sounds clear to auscultation  Cardiovascular:Negative CV ROS  Exercise tolerance: good (>4 METS),           Rhythm: regular  Rate: normal                    Neuro/Psych:   Negative Neuro/Psych ROS  (+) headaches:,             GI/Hepatic/Renal: Neg GI/Hepatic/Renal ROS  (+) GERD:,           Endo/Other: Negative Endo/Other ROS   (+) hypothyroidism::., .                 Abdominal: normal exam            Vascular: negative vascular ROS.         Other Findings:           Anesthesia Plan      MAC     ASA 2       Induction: intravenous.    MIPS: Postoperative opioids intended and Prophylactic antiemetics administered.  Anesthetic plan and risks discussed with patient.    Use of blood products discussed with patient whom consented to blood products.   Plan discussed with CRNA and surgical team.    Attending anesthesiologist reviewed and agrees with Preprocedure content                Lenell Antu, MD   05/09/2022

## 2022-05-09 NOTE — Progress Notes (Signed)
Verified patient name and date of birth, scheduled procedure, and informed consent. Reviewed general discharge instructions and driver information.  Assessed patient. Awake, alert, and oriented per baseline. Vital signs stable (see vital sign flowsheet). Respiratory status within defined limits, abdomen soft and non tender. Skin with in defined limits.

## 2022-06-11 ENCOUNTER — Inpatient Hospital Stay
Admit: 2022-06-11 | Discharge: 2022-06-11 | Disposition: A | Discharge status: Home / Self Care | Payer: MEDICAID | Attending: Emergency Medicine

## 2022-06-11 DIAGNOSIS — U071 COVID-19: Secondary | ICD-10-CM

## 2022-06-11 MED ORDER — GUAIFENESIN ER 600 MG PO TB12
600 MG | ORAL_TABLET | Freq: Two times a day (BID) | ORAL | 0 refills | Status: AC
Start: 2022-06-11 — End: 2022-06-26

## 2022-06-11 MED ORDER — GUAIFENESIN ER 600 MG PO TB12
600 MG | ORAL | Status: AC
Start: 2022-06-11 — End: 2022-06-11
  Administered 2022-06-11: 21:00:00 600 mg via ORAL

## 2022-06-11 MED ORDER — MECLIZINE HCL 25 MG PO TABS
25 MG | ORAL | Status: AC
Start: 2022-06-11 — End: 2022-06-11
  Administered 2022-06-11: 21:00:00 25 mg via ORAL

## 2022-06-11 MED ORDER — MECLIZINE HCL 25 MG PO TABS
25 MG | ORAL_TABLET | Freq: Three times a day (TID) | ORAL | 0 refills | Status: AC | PRN
Start: 2022-06-11 — End: 2022-06-21

## 2022-06-11 MED ORDER — ACETAMINOPHEN 325 MG PO TABS
325 MG | ORAL | Status: AC
Start: 2022-06-11 — End: 2022-06-11
  Administered 2022-06-11: 21:00:00 650 mg via ORAL

## 2022-06-11 MED ORDER — ONDANSETRON 4 MG PO TBDP
4 MG | ORAL_TABLET | Freq: Three times a day (TID) | ORAL | 0 refills | Status: AC | PRN
Start: 2022-06-11 — End: ?

## 2022-06-11 MED ORDER — ONDANSETRON 4 MG PO TBDP
4 MG | ORAL | Status: AC
Start: 2022-06-11 — End: 2022-06-11
  Administered 2022-06-11: 21:00:00 4 mg via ORAL

## 2022-06-11 MED FILL — ONDANSETRON 4 MG PO TBDP: 4 MG | ORAL | Qty: 1

## 2022-06-11 MED FILL — MUCUS RELIEF 600 MG PO TB12: 600 MG | ORAL | Qty: 1

## 2022-06-11 MED FILL — ACETAMINOPHEN 325 MG PO TABS: 325 MG | ORAL | Qty: 2

## 2022-06-11 MED FILL — MECLIZINE HCL 25 MG PO TABS: 25 MG | ORAL | Qty: 1

## 2022-06-11 NOTE — ED Notes (Signed)
Patient does not appear to be in any acute distress/shows no evidence of clinical instability at this time.     Provider has reviewed discharge instructions with the patient.  The patient verbalized understanding instructions as well as need for follow up for any further symptoms.     Discharge papers given, education provided, and any questions answered. Patient discharged by provider.      Norva Riffle, RN  06/11/22 [phone]

## 2022-06-11 NOTE — ED Triage Notes (Signed)
Patient arrives to ed via EMS with c/o headache, N/V/D, and dizziness x 2 days. Pt tested positive for covid 06/07/22. Pt sts she has not taken her vertigo medications.

## 2023-03-16 ENCOUNTER — Inpatient Hospital Stay: Admit: 2023-03-16 | Payer: MEDICAID

## 2023-03-16 LAB — COMPREHENSIVE METABOLIC PANEL
ALT: 16 U/L (ref 12–78)
AST: 11 U/L — ABNORMAL LOW (ref 15–37)
Albumin/Globulin Ratio: 1.3 (ref 1.1–2.2)
Albumin: 3.8 g/dL (ref 3.5–5.0)
Alk Phosphatase: 118 U/L — ABNORMAL HIGH (ref 45–117)
Anion Gap: 8 mmol/L (ref 2–12)
BUN/Creatinine Ratio: 16 (ref 12–20)
BUN: 18 mg/dL (ref 6–20)
CO2: 22 mmol/L (ref 21–32)
Calcium: 8.6 mg/dL (ref 8.5–10.1)
Chloride: 112 mmol/L — ABNORMAL HIGH (ref 97–108)
Creatinine: 1.1 mg/dL — ABNORMAL HIGH (ref 0.55–1.02)
Est, Glom Filt Rate: 60 mL/min/{1.73_m2} — ABNORMAL LOW (ref >60)
Globulin: 3 g/dL (ref 2.0–4.0)
Glucose: 97 mg/dL (ref 65–100)
Potassium: 4 mmol/L (ref 3.5–5.1)
Sodium: 142 mmol/L (ref 136–145)
Total Bilirubin: 0.3 mg/dL (ref 0.2–1.0)
Total Protein: 6.8 g/dL (ref 6.4–8.2)

## 2023-03-16 LAB — LIPID PANEL
Chol/HDL Ratio: 5.3 — ABNORMAL HIGH (ref 0.0–5.0)
Cholesterol, Total: 276 mg/dL — ABNORMAL HIGH (ref <200)
HDL: 52 mg/dL
LDL Cholesterol: 190.2 mg/dL — ABNORMAL HIGH (ref 0–100)
Triglycerides: 169 mg/dL — ABNORMAL HIGH (ref <150)
VLDL Cholesterol Calculated: 33.8 mg/dL

## 2023-03-16 LAB — CBC WITH AUTO DIFFERENTIAL
Basophils %: 1 % (ref 0–1)
Basophils Absolute: 0 10*3/uL (ref 0.0–0.1)
Eosinophils %: 2 % (ref 0–7)
Eosinophils Absolute: 0.1 10*3/uL (ref 0.0–0.4)
Hematocrit: 41.5 % (ref 35.0–47.0)
Hemoglobin: 13.6 g/dL (ref 11.5–16.0)
Immature Granulocytes %: 0 % (ref 0.0–0.5)
Immature Granulocytes Absolute: 0 10*3/uL (ref 0.00–0.04)
Lymphocytes %: 46 % (ref 12–49)
Lymphocytes Absolute: 3 10*3/uL (ref 0.8–3.5)
MCH: 30 pg (ref 26.0–34.0)
MCHC: 32.8 g/dL (ref 30.0–36.5)
MCV: 91.6 FL (ref 80.0–99.0)
MPV: 10.8 FL (ref 8.9–12.9)
Monocytes %: 8 % (ref 5–13)
Monocytes Absolute: 0.5 10*3/uL (ref 0.0–1.0)
Neutrophils %: 43 % (ref 32–75)
Neutrophils Absolute: 2.8 10*3/uL (ref 1.8–8.0)
Nucleated RBCs: 0 /100{WBCs}
Platelets: 189 10*3/uL (ref 150–400)
RBC: 4.53 M/uL (ref 3.80–5.20)
RDW: 13.3 % (ref 11.5–14.5)
WBC: 6.5 10*3/uL (ref 3.6–11.0)
nRBC: 0 10*3/uL (ref 0.00–0.01)

## 2023-03-16 LAB — VITAMIN D 25 HYDROXY: Vit D, 25-Hydroxy: 37.8 ng/mL (ref 30–100)

## 2023-03-16 LAB — TSH: TSH, 3rd Generation: 4.01 u[IU]/mL — ABNORMAL HIGH (ref 0.36–3.74)

## 2023-07-01 ENCOUNTER — Inpatient Hospital Stay
Admit: 2023-07-01 | Discharge: 2023-07-01 | Disposition: A | Discharge status: Home / Self Care | Admitting: Emergency Medicine

## 2023-07-01 DIAGNOSIS — S46912A Strain of unspecified muscle, fascia and tendon at shoulder and upper arm level, left arm, initial encounter: Secondary | ICD-10-CM

## 2023-07-01 MED ORDER — PREDNISONE 10 MG PO TABS
10 | ORAL_TABLET | ORAL | 0 refills | Status: DC
Start: 2023-07-01 — End: 2023-07-01

## 2023-07-01 MED ORDER — METHOCARBAMOL 500 MG PO TABS
500 | Freq: Once | ORAL | Status: AC
Start: 2023-07-01 — End: 2023-07-01
  Administered 2023-07-01: 18:00:00 1000 mg via ORAL

## 2023-07-01 MED ORDER — PREDNISONE 10 MG PO TABS
10 | ORAL_TABLET | ORAL | 0 refills | Status: AC
Start: 2023-07-01 — End: ?

## 2023-07-01 MED ORDER — KETOROLAC TROMETHAMINE 30 MG/ML IJ SOLN
30 | INTRAMUSCULAR | Status: AC
Start: 2023-07-01 — End: 2023-07-01
  Administered 2023-07-01: 18:00:00 30 mg via INTRAMUSCULAR

## 2023-07-01 MED ORDER — LIDOCAINE 4 % EX PTCH
4 | Freq: Once | CUTANEOUS | Status: DC
Start: 2023-07-01 — End: 2023-07-01
  Administered 2023-07-01: 18:00:00 1 via TRANSDERMAL

## 2023-07-01 MED ORDER — KETOROLAC TROMETHAMINE 10 MG PO TABS
10 | ORAL_TABLET | Freq: Four times a day (QID) | ORAL | 0 refills | Status: AC | PRN
Start: 2023-07-01 — End: ?

## 2023-07-01 MED ORDER — LIDOCAINE 4 % EX PTCH
4 | MEDICATED_PATCH | Freq: Every day | CUTANEOUS | 0 refills | Status: AC
Start: 2023-07-01 — End: 2023-07-31

## 2023-07-01 MED ORDER — METHOCARBAMOL 750 MG PO TABS
750 | ORAL_TABLET | Freq: Four times a day (QID) | ORAL | 0 refills | Status: AC
Start: 2023-07-01 — End: 2023-07-11

## 2023-07-01 MED ORDER — METHOCARBAMOL 750 MG PO TABS
750 | ORAL_TABLET | Freq: Four times a day (QID) | ORAL | 0 refills | Status: DC
Start: 2023-07-01 — End: 2023-07-01

## 2023-07-01 MED ORDER — ACETAMINOPHEN 500 MG PO TABS
500 | ORAL | Status: AC
Start: 2023-07-01 — End: 2023-07-01
  Administered 2023-07-01: 18:00:00 1000 mg via ORAL

## 2023-07-01 MED ORDER — LIDOCAINE 4 % EX PTCH
4 | MEDICATED_PATCH | Freq: Every day | CUTANEOUS | 0 refills | Status: DC
Start: 2023-07-01 — End: 2023-07-01

## 2023-07-01 MED ORDER — KETOROLAC TROMETHAMINE 10 MG PO TABS
10 | ORAL_TABLET | Freq: Four times a day (QID) | ORAL | 0 refills | Status: DC | PRN
Start: 2023-07-01 — End: 2023-07-01

## 2023-07-01 MED FILL — ACETAMINOPHEN EXTRA STRENGTH 500 MG PO TABS: 500 MG | ORAL | Qty: 2

## 2023-07-01 MED FILL — KETOROLAC TROMETHAMINE 30 MG/ML IJ SOLN: 30 MG/ML | INTRAMUSCULAR | Qty: 1

## 2023-07-01 MED FILL — METHOCARBAMOL 500 MG PO TABS: 500 MG | ORAL | Qty: 2

## 2023-07-01 MED FILL — LIDOCAINE PAIN RELIEF 4 % EX PTCH: 4 % | CUTANEOUS | Qty: 1

## 2023-07-01 NOTE — ED Provider Notes (Signed)
 [facility] EMERGENCY DEPT  EMERGENCY DEPARTMENT ENCOUNTER      Pt Name: Sherri Gardner  MRN: [id number]  Birthdate 1971/06/06  Date of evaluation: 07/01/2023  Provider: Juliene Pina, PA-C    CHIEF COMPLAINT       Chief Complaint   Patient presents with    Should

## 2023-07-01 NOTE — ED Triage Notes (Addendum)
 Pt reports to ED w/ cc of left shoulder pain x 1 month. Denies direct injury but states she may have slept on it weird.     Hx of chronic neck pain

## 2023-09-21 ENCOUNTER — Inpatient Hospital Stay
Admit: 2023-09-21 | Discharge: 2023-09-21 | Disposition: A | Discharge status: Home / Self Care | Payer: MEDICAID | Attending: Emergency Medicine

## 2023-09-21 DIAGNOSIS — R051 Acute cough: Secondary | ICD-10-CM

## 2023-09-21 LAB — COVID-19 & INFLUENZA COMBO
Rapid Influenza A By PCR: NOT DETECTED
Rapid Influenza B By PCR: NOT DETECTED
SARS-CoV-2, PCR: NOT DETECTED

## 2023-09-21 MED ORDER — GUAIFENESIN ER 600 MG PO TB12
600 | ORAL_TABLET | Freq: Two times a day (BID) | ORAL | 0 refills | Status: AC
Start: 2023-09-21 — End: 2023-10-01

## 2023-09-21 MED ORDER — BENZONATATE 100 MG PO CAPS
100 | ORAL_CAPSULE | Freq: Three times a day (TID) | ORAL | 0 refills | Status: AC | PRN
Start: 2023-09-21 — End: 2023-10-01

## 2023-09-21 NOTE — ED Notes (Signed)
 Pt discharged. Left w/o discharge paperwork.

## 2023-09-21 NOTE — ED Triage Notes (Signed)
 PT ambulatory to ED with complaints of cough, body aches, fever nasal congestion that started 3 days ago. PT has been taking OTC meds with no results.

## 2023-09-21 NOTE — Discharge Instructions (Signed)
 You were seen today for upper respiratory virus symptoms. Your symptoms appear to be due to a viral illness.  If imaging and lab work were done, these are reassuring enough to go home at this time. Viral illnesses are treated with supportive care, including increasing your fluid intake to 8-10 large glasses of water a day, over the counter fever and pain reducers, and rest. Take all other medications as prescribed and directed. If you smoke/ vape, please cut back on usage and try to stop as these can lengthen the time of your symptoms and possibly worsen them as well.     To limit the spread of your symptoms to others you should wash your hands frequently and keep surfaces in your home clean.  You condition should improve over the next 5 days with the care discussed. You should return to the ER- if you experience increased fevers that do not improve with use of Tylenol and Motrin (as directed),  increased shortness of breath, chest pain, changes in vision such as blurry vision, neck stiffness, confusion, or other worsening or worrisome concerns. You should follow up with your primary care physician this week for further evaluation. Call for an appointment today.

## 2023-09-21 NOTE — ED Provider Notes (Signed)
 [facility] EMERGENCY DEPARTMENT  EMERGENCY DEPARTMENT ENCOUNTER      Pt Name: Sherri Gardner  MRN: [id number]  Birthdate 20-Feb-1971  Date of evaluation: 09/21/2023  Provider: Laurey Morale, PA-C    CHIEF COMPLAINT       Chief Complaint   Patient presents with    Cough    Generalized Body Aches         HISTORY OF PRESENT ILLNESS   (Location/Symptom, Timing/Onset, Context/Setting, Quality, Duration, Modifying Factors, Severity)  Note limiting factors.   Sherri Gardner is a 53 y.o. female who presents to the emergency department for body aches, cough and congestion for the past 3 days.  Denies chest pain, shortness of breath, abdominal pain, constipation, N,V,D, fever or chills.  She has been taking over-the-counter Tylenol and ibuprofen.  Denies any known sick contacts.      The history is provided by the patient.         Review of External Medical Records:     Nursing Notes were reviewed.    REVIEW OF SYSTEMS    (2-9 systems for level 4, 10 or more for level 5)     Review of Systems    Except as noted above the remainder of the review of systems was reviewed and negative.       PAST MEDICAL HISTORY     Past Medical History:   Diagnosis Date    Barrett's esophagus     GERD (gastroesophageal reflux disease)     Hypothyroid     Migraines     Neuropathy          SURGICAL HISTORY       Past Surgical History:   Procedure Laterality Date    CARPAL TUNNEL RELEASE Bilateral     CESAREAN SECTION      LITHOTRIPSY      UPPER GASTROINTESTINAL ENDOSCOPY N/A 05/09/2022    EGD ESOPHAGOGASTRODUODENOSCOPY performed by Alma Downs, MD at [facility] ENDOSCOPY         CURRENT MEDICATIONS       Previous Medications    ATORVASTATIN CALCIUM (LIPITOR PO)    Take by mouth    CETIRIZINE (ZYRTEC) 10 MG TABLET    Take 1 tablet by mouth daily    CYCLOBENZAPRINE (FLEXERIL) 10 MG TABLET        DICLOFENAC SODIUM (VOLTAREN) 1 % GEL    USE AS DIRECTED EXTERNALLY TO AFFECTED AREA(S) 1 TO 2 TIMES A DAY AS NEEDED FOR PAIN 30 DAYS    GABAPENTIN (NEURONTIN) 300 MG  CAPSULE        HYDROXYZINE HCL (ATARAX) 25 MG TABLET    Take 1 tablet by mouth every 8 hours as needed    KETOROLAC (TORADOL) 10 MG TABLET    Take 1 tablet by mouth every 6 hours as needed for Pain Do not take for more than 4 days    LEVOTHYROXINE (SYNTHROID) 25 MCG TABLET        ONDANSETRON (ZOFRAN-ODT) 4 MG DISINTEGRATING TABLET    Take 1 tablet by mouth 3 times daily as needed for Nausea or Vomiting    PANTOPRAZOLE (PROTONIX) 40 MG TABLET        PREDNISONE (DELTASONE) 10 MG TABLET    Take 5 tablets daily for 4 days, followed by 4 tablets for 2 days, followed by 3 tablets for 2 days, followed by 2 tablets for 2 days, followed by 1 tablet for 2 days for a total of 40 tablets over 12 days  SUMATRIPTAN (IMITREX) 50 MG TABLET    TAKE 1 TABLET EVERY 4 HOURS AS NEEDED FOR MIGRAINE. MAXIMUM OF 2 TABLETS PER DAY AND 10 PER MONTH    TOPIRAMATE (TOPAMAX) 100 MG TABLET           ALLERGIES     Aspirin    FAMILY HISTORY     History reviewed. No pertinent family history.       SOCIAL HISTORY       Social History     Socioeconomic History    Marital status: Single     Spouse name: None    Number of children: None    Years of education: None    Highest education level: None   Tobacco Use    Smoking status: Every Day     Current packs/day: 1.00     Types: Cigarettes    Smokeless tobacco: Never   Vaping Use    Vaping status: Never Used   Substance and Sexual Activity    Alcohol use: Not Currently    Drug use: Never           PHYSICAL EXAM    (up to 7 for level 4, 8 or more for level 5)     ED Triage Vitals   BP Systolic BP Percentile Diastolic BP Percentile Temp Temp src Pulse Resp SpO2   -- -- -- -- -- -- -- --      Height Weight         -- --             Body mass index is 37.03 kg/m.    Physical Exam  Vitals and nursing note reviewed.   Constitutional:       General: She is not in acute distress.     Appearance: Normal appearance. She is not ill-appearing.   HENT:      Head: Normocephalic.      Right Ear: External ear normal.       Left Ear: External ear normal.      Nose: Congestion present. No rhinorrhea.      Mouth/Throat:      Mouth: Mucous membranes are moist.   Eyes:      General:         Right eye: No discharge.         Left eye: No discharge.   Cardiovascular:      Rate and Rhythm: Normal rate and regular rhythm.      Pulses: Normal pulses.      Heart sounds: Normal heart sounds. No murmur heard.     No friction rub. No gallop.   Pulmonary:      Effort: Pulmonary effort is normal.      Breath sounds: Normal breath sounds. No stridor. No wheezing, rhonchi or rales.   Abdominal:      General: Abdomen is flat. There is no distension.      Palpations: Abdomen is soft.      Tenderness: There is no abdominal tenderness. There is no right CVA tenderness, left CVA tenderness, guarding or rebound.   Musculoskeletal:      Cervical back: Normal range of motion.   Skin:     General: Skin is warm and dry.      Capillary Refill: Capillary refill takes less than 2 seconds.   Neurological:      General: No focal deficit present.      Mental Status: She is alert and oriented to person, place, and time.  Sensory: No sensory deficit.   Psychiatric:         Mood and Affect: Mood normal.         Behavior: Behavior normal.         DIAGNOSTIC RESULTS     EKG: All EKG's are interpreted by the Emergency Department Physician who either signs or Co-signs this chart in the absence of a cardiologist.        RADIOLOGY:   Non-plain film images such as CT, Ultrasound and MRI are read by the radiologist. Plain radiographic images are visualized and preliminarily interpreted by the emergency physician with the below findings:        Interpretation per the Radiologist below, if available at the time of this note:    No orders to display        LABS:  Labs Reviewed   COVID-19 & INFLUENZA COMBO       All other labs were within normal range or not returned as of this dictation.    EMERGENCY DEPARTMENT COURSE and DIFFERENTIAL DIAGNOSIS/MDM:   Vitals:    Vitals:     09/21/23 1553   BP: (!) 134/91   Pulse: 83   Resp: 18   Temp: 99 F (37.2 C)   TempSrc: Oral   SpO2: 99%   Weight: 88.9 kg (196 lb)   Height: 1.549 m (5\' 1" )           Medical Decision Making  Sherri Gardner is a 53 y.o. female who presents to the emergency department for cough, congestion and body aches for the past 3 days. Vitals in triage are benign. Physical exam shows well-appearing nontoxic female who is sitting comfortably.  Abdomen is nontender nonperitoneal.  Clear lung sounds on auscultation bilaterally.  Clear cardiac sounds on auscultation.  Differential diagnosis includes but is not limited to Viral URI, pneumonia, COVID, flu, peritonsillar abscess, strep pharyngitis, viral pharyngitis, epiglottitis.  I considered a CT soft tissue of the neck however this time patient's physical exam is unremarkable, uvula is midline and patient is afebrile making peritonsillar abscess as well as epiglottitis unlikely. I considered a chest xray however at this time patient is not complaining of SOB, lung sounds are clear on auscultation and patient is afebrile making pneumonia unlikely. Labs performed today included COVID and flu which were negative.  I discussed all results with the patient in detail today.  At this time patient is stable for discharge and return precautions were discussed. Patient verbalized understanding. I advised close follow up with primary care physician for further evaluation. All questions answered.     Risk  OTC drugs.  Prescription drug management.            REASSESSMENT            CONSULTS:  None    PROCEDURES:  Unless otherwise noted below, none     Procedures      FINAL IMPRESSION      1. Acute cough          DISPOSITION/PLAN   DISPOSITION Decision To Discharge 09/21/2023 04:38:41 PM      PATIENT REFERRED TO:  [facility] Emergency Department  [address]  [place] [address]  [phone]    If symptoms worsen    [facility] [place]  [address]  [place] [address]  [phone]  Schedule an appointment as soon as possible for a visit  DISCHARGE MEDICATIONS:  New Prescriptions    BENZONATATE (TESSALON) 100 MG CAPSULE    Take 1 capsule by mouth 3 times daily as needed for Cough    GUAIFENESIN (MUCINEX) 600 MG EXTENDED RELEASE TABLET    Take 2 tablets by mouth 2 times daily for 10 days         (Please note that portions of this note were completed with a voice recognition program.  Efforts were made to edit the dictations but occasionally words are mis-transcribed.)    Laurey Morale, PA-C (electronically signed)  Emergency Attending Physician / Physician Assistant / Nurse Practitioner             Laurey Morale, PA-C  09/21/23 1639

## 2023-12-17 ENCOUNTER — Inpatient Hospital Stay
Admit: 2023-12-17 | Discharge: 2023-12-17 | Disposition: A | Discharge status: Home / Self Care | Payer: Medicaid (Managed Care) | Attending: Emergency Medicine

## 2023-12-17 ENCOUNTER — Emergency Department: Admit: 2023-12-17 | Payer: Medicaid (Managed Care)

## 2023-12-17 DIAGNOSIS — M25512 Pain in left shoulder: Secondary | ICD-10-CM

## 2023-12-17 MED ORDER — METHOCARBAMOL 750 MG PO TABS
750 | ORAL_TABLET | Freq: Four times a day (QID) | ORAL | 0 refills | 10.00000 days | Status: AC
Start: 2023-12-17 — End: 2023-12-24

## 2023-12-17 MED ORDER — ACETAMINOPHEN 500 MG PO TABS
500 | ORAL | Status: AC
Start: 2023-12-17 — End: 2023-12-17
  Administered 2023-12-17: 18:00:00 1000 mg via ORAL

## 2023-12-17 MED ORDER — PREDNISONE 50 MG PO TABS
50 | ORAL_TABLET | Freq: Every day | ORAL | 0 refills | 6.00000 days | Status: AC
Start: 2023-12-17 — End: 2023-12-22

## 2023-12-17 MED ORDER — PREDNISONE 20 MG PO TABS
20 | ORAL | Status: AC
Start: 2023-12-17 — End: 2023-12-17
  Administered 2023-12-17: 18:00:00 60 mg via ORAL

## 2023-12-17 MED FILL — ACETAMINOPHEN EXTRA STRENGTH 500 MG PO TABS: 500 MG | ORAL | Qty: 2 | Fill #0

## 2023-12-17 MED FILL — PREDNISONE 20 MG PO TABS: 20 MG | ORAL | Qty: 3 | Fill #0

## 2023-12-17 NOTE — ED Notes (Signed)
 Patient does not appear to be in any acute distress/shows no evidence of clinical instability at this time.     Provider has reviewed discharge instructions with the patient/family.  The patient/family verbalized understanding instructions as well as need for follow up for any further symptoms.     Discharge papers given, education provided, and any questions answered. Patient discharged by provider.

## 2023-12-17 NOTE — ED Provider Notes (Cosign Needed)
 [facility] EMERGENCY DEPARTMENT  EMERGENCY DEPARTMENT ENCOUNTER      Pt Name: Sherri Gardner  MRN: [id number]  Birthdate 05-21-1971  Date of evaluation: 12/17/2023  Provider: Auther Bo, PA-C    CHIEF COMPLAINT       Chief Complaint   Patient presents with    Shoulder Pain     L         HISTORY OF PRESENT ILLNESS   (Location/Symptom, Timing/Onset, Context/Setting, Quality, Duration, Modifying Factors, Severity)  Note limiting factors.   53 year old female with a past medical history of migraines, hypothyroidism, GERD, anxiety/depression presents with complaint of left shoulder pain.  Patient states waking up with the pain and stiffness 3 days ago.  She states I think I slept weird.  She denies any falls or injuries to the area.  She does admit to doing frequent heavy lifting at work.  She reports pain is located in the front of her shoulder and is having a hard time lifting her arm all the way up.  Denies chest pain or shortness of breath.  She is comfortable at rest and has pain with movement of the arm.  No medications taken prior to arrival.  No other complaints at this time.    The history is provided by the patient.         Review of External Medical Records:     Nursing Notes were reviewed.    REVIEW OF SYSTEMS    (2-9 systems for level 4, 10 or more for level 5)     Review of Systems    Except as noted above the remainder of the review of systems was reviewed and negative.       PAST MEDICAL HISTORY     Past Medical History:   Diagnosis Date    Barrett's esophagus     GERD (gastroesophageal reflux disease)     Hypothyroid     Migraines     Neuropathy          SURGICAL HISTORY       Past Surgical History:   Procedure Laterality Date    CARPAL TUNNEL RELEASE Bilateral     CESAREAN SECTION      LITHOTRIPSY      UPPER GASTROINTESTINAL ENDOSCOPY N/A 05/09/2022    EGD ESOPHAGOGASTRODUODENOSCOPY performed by Soundra Duval, MD at [facility] ENDOSCOPY         CURRENT MEDICATIONS       Previous Medications     ATORVASTATIN CALCIUM (LIPITOR PO)    Take by mouth    CETIRIZINE (ZYRTEC) 10 MG TABLET    Take 1 tablet by mouth daily    CYCLOBENZAPRINE (FLEXERIL) 10 MG TABLET        DICLOFENAC SODIUM (VOLTAREN) 1 % GEL    USE AS DIRECTED EXTERNALLY TO AFFECTED AREA(S) 1 TO 2 TIMES A DAY AS NEEDED FOR PAIN 30 DAYS    GABAPENTIN (NEURONTIN) 300 MG CAPSULE        HYDROXYZINE HCL (ATARAX) 25 MG TABLET    Take 1 tablet by mouth every 8 hours as needed    KETOROLAC  (TORADOL ) 10 MG TABLET    Take 1 tablet by mouth every 6 hours as needed for Pain Do not take for more than 4 days    LEVOTHYROXINE (SYNTHROID) 25 MCG TABLET        ONDANSETRON  (ZOFRAN -ODT) 4 MG DISINTEGRATING TABLET    Take 1 tablet by mouth 3 times daily as needed for Nausea or Vomiting  PANTOPRAZOLE (PROTONIX) 40 MG TABLET        PREDNISONE  (DELTASONE ) 10 MG TABLET    Take 5 tablets daily for 4 days, followed by 4 tablets for 2 days, followed by 3 tablets for 2 days, followed by 2 tablets for 2 days, followed by 1 tablet for 2 days for a total of 40 tablets over 12 days    SUMATRIPTAN (IMITREX) 50 MG TABLET    TAKE 1 TABLET EVERY 4 HOURS AS NEEDED FOR MIGRAINE. MAXIMUM OF 2 TABLETS PER DAY AND 10 PER MONTH    TOPIRAMATE (TOPAMAX) 100 MG TABLET           ALLERGIES     Aspirin    FAMILY HISTORY     No family history on file.       SOCIAL HISTORY       Social History     Socioeconomic History    Marital status: Single   Tobacco Use    Smoking status: Every Day     Current packs/day: 1.00     Types: Cigarettes    Smokeless tobacco: Never   Vaping Use    Vaping status: Never Used   Substance and Sexual Activity    Alcohol use: Not Currently    Drug use: Never           PHYSICAL EXAM    (up to 7 for level 4, 8 or more for level 5)     ED Triage Vitals   BP Systolic BP Percentile Diastolic BP Percentile Temp Temp Source Pulse Respirations SpO2   12/17/23 1353 -- -- 12/17/23 1357 12/17/23 1357 12/17/23 1353 12/17/23 1353 12/17/23 1353   (!) 143/93   98.2 F (36.8 C) Oral 73  18 99 %      Height Weight - Scale         12/17/23 1353 12/17/23 1357         1.549 m (5' 1) 88 kg (194 lb)             Body mass index is 36.66 kg/m.    Physical Exam  Vitals and nursing note reviewed.   Constitutional:       General: She is not in acute distress.     Appearance: Normal appearance. She is not ill-appearing.   HENT:      Head: Normocephalic and atraumatic.   Eyes:      Extraocular Movements: Extraocular movements intact.      Pupils: Pupils are equal, round, and reactive to light.   Cardiovascular:      Rate and Rhythm: Normal rate and regular rhythm.      Pulses: Normal pulses.      Heart sounds: No murmur heard.  Pulmonary:      Effort: Pulmonary effort is normal. No respiratory distress.      Breath sounds: Normal breath sounds.   Musculoskeletal:         General: Tenderness present. No swelling or deformity. Normal range of motion.      Cervical back: Normal range of motion and neck supple.      Right lower leg: No edema.      Left lower leg: No edema.      Comments: Tenderness to palpation over the superior and anterior aspect of the left shoulder in the bicipital groove.  No swelling or deformity.  Limited range of motion secondary to pain.  Neurovascularly intact.   Skin:     General: Skin is warm and  dry.      Capillary Refill: Capillary refill takes less than 2 seconds.      Coloration: Skin is not pale.      Findings: No rash.   Neurological:      General: No focal deficit present.      Mental Status: She is alert and oriented to person, place, and time. Mental status is at baseline.         DIAGNOSTIC RESULTS     EKG: All EKG's are interpreted by the Emergency Department Physician who either signs or Co-signs this chart in the absence of a cardiologist.        RADIOLOGY:   Non-plain film images such as CT, Ultrasound and MRI are read by the radiologist. Plain radiographic images are visualized and preliminarily interpreted by the emergency physician with the below  findings:        Interpretation per the Radiologist below, if available at the time of this note:    XR SHOULDER LEFT (MIN 2 VIEWS)   Final Result   No acute abnormality.      Electronically signed by Mearl Spice PEAT           LABS:  Labs Reviewed - No data to display    All other labs were within normal range or not returned as of this dictation.    EMERGENCY DEPARTMENT COURSE and DIFFERENTIAL DIAGNOSIS/MDM:   Vitals:    Vitals:    12/17/23 1353 12/17/23 1357 12/17/23 1500   BP: (!) 143/93  (!) 145/86   Pulse: 73  60   Resp: 18  16   Temp:  98.2 F (36.8 C)    TempSrc:  Oral    SpO2: 99%  100%   Weight:  88 kg (194 lb)    Height: 1.549 m (5' 1)             Medical Decision Making  53 year old patient presents with complaint of left shoulder pain/stiffness.  Patient is well-appearing, nontoxic, and with normal vital signs.  On exam, patient with tenderness to palpation over the superior and anterior aspects of the left shoulder, especially in the bicipital groove.  Limited range of motion secondary to pain.  No swelling or deformity.  Neurovascularly intact.  I have a low suspicion for cardiopulmonary etiology given patient is comfortable at rest and has worsening symptoms with movement or touching the area.  Upon chart review, patient has this same issues 6 months ago.  Differential diagnosis includes, but is not limited to, arthritis flare, tendonitis, frozen shoulder, rotator cuff pathology.  X-ray of the left shoulder was obtained which was negative for acute process.  Patient was given pain medication while in the ED.    Patient is stable for discharge home at this time.  Will provide with short course of steroids and muscle relaxer.  Return precautions discussed with patient who expresses understanding and is in agreement with the current plan.  Recommend follow-up with PCP as well as ortho, referral provided.    Amount and/or Complexity of Data Reviewed  Radiology: ordered.    Risk  OTC drugs.  Prescription  drug management.        ED Course as of 12/17/23 1512   Tue Dec 17, 2023   1400 Patient denies ever having had this before, however upon chart review she was seen for the same complaint and story in December 2024. [AF]      ED Course User Index  [AF] Emersen Mascari K,  PA-C             FINAL IMPRESSION      1. Acute pain of left shoulder          DISPOSITION/PLAN   DISPOSITION Decision To Discharge 12/17/2023 03:05:38 PM      PATIENT REFERRED TO:  [facility] Emergency Department  [address]  [place]  [phone]  [phone]    As needed, If symptoms worsen    Your PCP    In 2 days      [facility]  [facility] [address]  [address]  [address]  [place]  [address]  [phone]  Schedule an appointment as soon as possible for a visit         DISCHARGE MEDICATIONS:  New Prescriptions    METHOCARBAMOL  (ROBAXIN ) 750 MG TABLET    Take 1 tablet by mouth 4 times daily for 7 days    PREDNISONE  (DELTASONE ) 50 MG TABLET    Take 1 tablet by mouth daily for 5 days         (Please note that portions of this note were completed with a voice recognition program.  Efforts were made to edit the dictations but occasionally words are mis-transcribed.)    Auther Bo, PA-C (electronically signed)  Emergency Attending Physician / Physician Assistant / Nurse Practitioner             Auther Bo, PA-C  12/17/23 1512

## 2023-12-17 NOTE — ED Triage Notes (Signed)
 Patient arrives c/o limited ROM in L shoulder d/t pain since Saturday. Denies known injury to area. States woke up with stiffness in it.
